# Patient Record
Sex: Male | Born: 1950 | Race: White | Hispanic: No | Marital: Married | Smoking: Never smoker
Health system: Southern US, Community
[De-identification: ages and names within clinical notes are randomized; demographics above are authoritative.]

## PROBLEM LIST (undated history)

## (undated) DIAGNOSIS — I10 Essential (primary) hypertension: Secondary | ICD-10-CM

## (undated) DIAGNOSIS — E7800 Pure hypercholesterolemia, unspecified: Secondary | ICD-10-CM

## (undated) DIAGNOSIS — N400 Enlarged prostate without lower urinary tract symptoms: Secondary | ICD-10-CM

## (undated) HISTORY — PX: TONSILLECTOMY: SUR1361

---

## 2015-03-07 ENCOUNTER — Ambulatory Visit
Admission: EM | Admit: 2015-03-07 | Discharge: 2015-03-07 | Disposition: A | Discharge status: Home / Self Care | Payer: Worker's Compensation | Attending: Internal Medicine | Admitting: Internal Medicine

## 2015-03-07 ENCOUNTER — Ambulatory Visit: Payer: Worker's Compensation

## 2015-03-07 DIAGNOSIS — W312XXA Contact with powered woodworking and forming machines, initial encounter: Secondary | ICD-10-CM | POA: Diagnosis not present

## 2015-03-07 DIAGNOSIS — S62631B Displaced fracture of distal phalanx of left index finger, initial encounter for open fracture: Secondary | ICD-10-CM | POA: Insufficient documentation

## 2015-03-07 DIAGNOSIS — S62638B Displaced fracture of distal phalanx of other finger, initial encounter for open fracture: Secondary | ICD-10-CM

## 2015-03-07 DIAGNOSIS — S61218A Laceration without foreign body of other finger without damage to nail, initial encounter: Secondary | ICD-10-CM | POA: Diagnosis present

## 2015-03-07 HISTORY — DX: Benign prostatic hyperplasia without lower urinary tract symptoms: N40.0

## 2015-03-07 HISTORY — DX: Pure hypercholesterolemia, unspecified: E78.00

## 2015-03-07 HISTORY — DX: Essential (primary) hypertension: I10

## 2015-03-07 MED ORDER — SULFAMETHOXAZOLE-TRIMETHOPRIM 800-160 MG PO TABS: 1.0000 | ORAL_TABLET | Freq: Two times a day (BID) | ORAL | Status: AC

## 2015-03-07 MED ORDER — LIDOCAINE-EPINEPHRINE-TETRACAINE (LET) SOLUTION
3.0000 mL | Freq: Once | NASAL | Status: AC
  Administered 2015-03-07: 3 mL via TOPICAL

## 2015-03-07 NOTE — Discharge Instructions (Signed)
Clean and Dry ! Elevate !! Additional instructions at [facility] tomorrow!  Thank you for choosing us for your care tonight. Rae HalstedLaurie W. Malana Eberwein PA-C      Laceration Care, Adult A laceration is a cut or lesion that goes through all layers of the skin and into the tissue just beneath the skin. TREATMENT  Some lacerations may not require closure. Some lacerations may not be able to be closed due to an increased risk of infection. It is important to see your caregiver as soon as possible after an injury to minimize the risk of infection and maximize the opportunity for successful closure. If closure is appropriate, pain medicines may be given, if needed. The wound will be cleaned to help prevent infection. Your caregiver will use stitches (sutures), staples, wound glue (adhesive), or skin adhesive strips to repair the laceration. These tools bring the skin edges together to allow for faster healing and a better cosmetic outcome. However, all wounds will heal with a scar. Once the wound has healed, scarring can be minimized by covering the wound with sunscreen during the day for 1 full year. HOME CARE INSTRUCTIONS  For sutures or staples:  Keep the wound clean and dry.  If you were given a bandage (dressing), you should change it at least once a day. Also, change the dressing if it becomes wet or dirty, or as directed by your caregiver.  Wash the wound with soap and water 2 times a day. Rinse the wound off with water to remove all soap. Pat the wound dry with a clean towel.  After cleaning, apply a thin layer of the antibiotic ointment as recommended by your caregiver. This will help prevent infection and keep the dressing from sticking.  You may shower as usual after the first 24 hours. Do not soak the wound in water until the sutures are removed.  Only take over-the-counter or prescription medicines for pain, discomfort, or fever as directed by your caregiver.  Get your sutures or  staples removed as directed by your caregiver. For skin adhesive strips:  Keep the wound clean and dry.  Do not get the skin adhesive strips wet. You may bathe carefully, using caution to keep the wound dry.  If the wound gets wet, pat it dry with a clean towel.  Skin adhesive strips will fall off on their own. You may trim the strips as the wound heals. Do not remove skin adhesive strips that are still stuck to the wound. They will fall off in time. For wound adhesive:  You may briefly wet your wound in the shower or bath. Do not soak or scrub the wound. Do not swim. Avoid periods of heavy perspiration until the skin adhesive has fallen off on its own. After showering or bathing, gently pat the wound dry with a clean towel.  Do not apply liquid medicine, cream medicine, or ointment medicine to your wound while the skin adhesive is in place. This may loosen the film before your wound is healed.  If a dressing is placed over the wound, be careful not to apply tape directly over the skin adhesive. This may cause the adhesive to be pulled off before the wound is healed.  Avoid prolonged exposure to sunlight or tanning lamps while the skin adhesive is in place. Exposure to ultraviolet light in the first year will darken the scar.  The skin adhesive will usually remain in place for 5 to 10 days, then naturally fall off the skin. Do  not pick at the adhesive film. You may need a tetanus shot if:  You cannot remember when you had your last tetanus shot.  You have never had a tetanus shot. If you get a tetanus shot, your arm may swell, get red, and feel warm to the touch. This is common and not a problem. If you need a tetanus shot and you choose not to have one, there is a rare chance of getting tetanus. Sickness from tetanus can be serious. SEEK MEDICAL CARE IF:   You have redness, swelling, or increasing pain in the wound.  You see a red line that goes away from the wound.  You have  yellowish-white fluid (pus) coming from the wound.  You have a fever.  You notice a bad smell coming from the wound or dressing.  Your wound breaks open before or after sutures have been removed.  You notice something coming out of the wound such as wood or glass.  Your wound is on your hand or foot and you cannot move a finger or toe. SEEK IMMEDIATE MEDICAL CARE IF:   Your pain is not controlled with prescribed medicine.  You have severe swelling around the wound causing pain and numbness or a change in color in your arm, hand, leg, or foot.  Your wound splits open and starts bleeding.  You have worsening numbness, weakness, or loss of function of any joint around or beyond the wound.  You develop painful lumps near the wound or on the skin anywhere on your body. MAKE SURE YOU:   Understand these instructions.  Will watch your condition.  Will get help right away if you are not doing well or get worse. Document Released: 08/13/2005 Document Revised: 11/05/2011 Document Reviewed: 02/06/2011 [facility] Patient Information 2015 [place], [place]. This information is not intended to replace advice given to you by your health care provider. Make sure you discuss any questions you have with your health care provider.

## 2015-03-07 NOTE — ED Notes (Signed)
Pt states "I cut my finger with a table saw."

## 2015-03-09 DIAGNOSIS — S62638B Displaced fracture of distal phalanx of other finger, initial encounter for open fracture: Secondary | ICD-10-CM | POA: Diagnosis not present

## 2015-03-09 NOTE — ED Provider Notes (Signed)
CSN: [id number]     Arrival date & time 03/07/15  1554 History   First MD Initiated Contact with Patient 03/07/15 1644     Chief Complaint  Patient presents with  . Laceration   (Consider location/radiation/quality/duration/timing/severity/associated sxs/prior Treatment) HPI 64 yo M using tablesaw at work, in error caught distal tip of left index finger in saw and partially amputated the tip about 4pm . Reports last injury and tetanus were 2 years ago, another laceration on same hand. He is coping with the injury, denies severe pain  Past Medical History  Diagnosis Date  . Hypertension   . Hypercholesteremia   . BPH (benign prostatic hyperplasia)    History reviewed. No pertinent past surgical history. History reviewed. No pertinent family history. History  Substance Use Topics  . Smoking status: Never Smoker   . Smokeless tobacco: Not on file  . Alcohol Use: 1.2 oz/week    2 Cans of beer per week    Review of Systems Constitutional: No fever.  Eyes: No visual changes. ENT:No sore throat. Cardiovascular:Negative for chest pain/palpitations,cardiac  Respiratory: Negative for shortness of breath, seasonal allergies Gastrointestinal: No abdominal pain. No nausea,vomiting, diarrhea Genitourinary: Negative for dysuria. Normal urination. Musculoskeletal: Negative for back pain. FROM extremities, pain distal left index finger; arthritis Skin: Negative for rash Neurological: Negative for headache, focal weakness or numbness  Allergies  Penicillins and Latex  Home Medications   Prior to Admission medications   Medication Sig Start Date End Date Taking? Authorizing Provider  alfuzosin (UROXATRAL) 10 MG 24 hr tablet Take 10 mg by mouth daily with breakfast.   Yes Historical Provider, MD  aspirin 81 MG tablet Take 81 mg by mouth daily.   Yes Historical Provider, MD  atorvastatin (LIPITOR) 10 MG tablet Take 10 mg by mouth daily.   Yes Historical Provider, MD  diclofenac (VOLTAREN)  75 MG EC tablet Take 75 mg by mouth 2 (two) times daily.   Yes Historical Provider, MD  diltiazem (CARTIA XT) 240 MG 24 hr capsule Take 240 mg by mouth daily.   Yes Historical Provider, MD  fluticasone (FLONASE) 50 MCG/ACT nasal spray Place into both nostrils daily.   Yes Historical Provider, MD  hydrochlorothiazide (MICROZIDE) 12.5 MG capsule Take 12.5 mg by mouth daily.   Yes Historical Provider, MD  sulfamethoxazole-trimethoprim (BACTRIM DS,SEPTRA DS) 800-160 MG per tablet Take 1 tablet by mouth 2 (two) times daily. 03/07/15 03/14/15  Rae Halsted, PA-C   BP 132/87 mmHg  Pulse 60  Temp(Src) 97.7 F (36.5 C) (Oral)  Resp 16  Ht  (1.753 m)  Wt 141 lb (63.957 kg)  BMI 20.81 kg/m2  SpO2 100% Physical Exam   Constitutional -alert and oriented,well appearing and in mild distress, concern over injury Head-atraumatic, normocephalic Eyes- conjunctiva normal, EOMI ,conjugate gaze Nose- no congestion or rhinorrhea Mouth/throat- mucous membranes moist , Neck- supple  CV- regular rate, grossly normal heart sounds,  Resp-no distress, normal respiratory effort,clear to auscultation bilaterally GI- soft,non-tender,no distention GU- not examined MSK- no tender, normal ROM, ambulatory, self-care; distal index left with open fracture- he can flex digit,reports good sensation except for flap, good grip; active bleeding Neuro- normal speech and language, no gross focal neurological deficit appreciated, no gait instability, Skin-warm,dry ,intact; no rash noted Psych-mood and affect grossly normal; speech and behavior grossly normal  ED Course  LACERATION REPAIR Date/Time: 03/09/2015 11:57 PM Performed by: Rae Halsted Authorized by: Eustace Moore Consent: Verbal consent obtained. Written consent not obtained. Risks  and benefits: risks, benefits and alternatives were discussed Consent given by: patient Patient understanding: patient states understanding of the procedure being  performed Imaging studies: imaging studies available Patient identity confirmed: verbally with patient and arm band Body area: upper extremity Location details: left index finger Laceration length: 2.5 cm Foreign bodies: unknown Tendon involvement: none Nerve involvement: none Anesthesia: local infiltration and digital block Local anesthetic: lidocaine 1% without epinephrine and LET (lido,epi,tetracaine) Anesthetic total: 4 ml Patient sedated: no Preparation: Patient was prepped and draped in the usual sterile fashion. Irrigation solution: saline Irrigation method: syringe Amount of cleaning: extensive Debridement: none Degree of undermining: none Skin closure: 3-0 nylon Number of sutures: 4 Technique: simple Approximation: loose Approximation difficulty: complex Dressing: pressure dressing and gauze roll Patient tolerance: Patient tolerated the procedure well with no immediate complications   (including critical care time) Labs Review Labs Reviewed - No data to display  Imaging Review No results found. Xrays did not drop- Open fracture of distal  phalanx  left index finger. Probable small bone fragments or small foreign bodies within the soft tissue.   MDM   1. Open fracture of distal phalanx of index finger,left    Dressing and wound care reviewed with patient - To keep clean and dry -elevated as much as possible Disc of films and written report sent with patient. [facility] [place] [facility] and wishes to have consultation and follow up care there tomorrow. Understands we have closed wound with loose approximating sutures given report of potential bone fragment  or foreign bodies. Ortho may opt to go to surgery or not. He has connection with Ortho in [place] and will seek care/consultation with them in the AM Final suture removal can be accompilshed in [place] or here pending the outcome of his consultation tomorrow. Antibiotic Rx  has been printed and sent with him for his convenience. He denies need for pain Rx. Encouraged to keep elevated to minimize swelling and discomfort. Encourage at least ibuprofen up to 800 mg TID with meals as needed. Diagnosis and treatment discussed. . Questions fielded, expectations and recommendations reviewed. Patient expresses understanding. [facility] with questions, concern or exacerbation.     Rae HalstedLaurie W Lee, PA-C 03/10/15 0010

## 2015-03-21 ENCOUNTER — Encounter: Payer: Self-pay | Admitting: Emergency Medicine

## 2015-03-21 ENCOUNTER — Ambulatory Visit
Admission: EM | Admit: 2015-03-21 | Discharge: 2015-03-21 | Disposition: A | Discharge status: Home / Self Care | Payer: Worker's Compensation | Attending: Internal Medicine | Admitting: Internal Medicine

## 2015-03-21 DIAGNOSIS — Z4802 Encounter for removal of sutures: Secondary | ICD-10-CM

## 2015-03-21 DIAGNOSIS — Z4801 Encounter for change or removal of surgical wound dressing: Secondary | ICD-10-CM

## 2015-03-21 DIAGNOSIS — S61412D Laceration without foreign body of left hand, subsequent encounter: Secondary | ICD-10-CM

## 2015-03-21 DIAGNOSIS — S61219D Laceration without foreign body of unspecified finger without damage to nail, subsequent encounter: Secondary | ICD-10-CM

## 2015-03-21 MED ORDER — MUPIROCIN 2 % EX OINT: 1.0000 "application " | TOPICAL_OINTMENT | Freq: Two times a day (BID) | CUTANEOUS | Status: AC

## 2015-03-21 NOTE — ED Notes (Signed)
Sutures to left index finger 2 weeks ago here. Returned for removal.

## 2015-03-21 NOTE — Discharge Instructions (Signed)

## 2015-03-22 NOTE — ED Provider Notes (Signed)
CSN: [id number]     Arrival date & time 03/21/15  8413 History   First MD Initiated Contact with Patient 03/21/15 1149     Chief Complaint  Patient presents with  . Suture / Staple Removal   (Consider location/radiation/quality/duration/timing/severity/associated sxs/prior Treatment) HPI Comments: Caucasian male here for suture removal after table saw injury to left index finger on 07 Mar 2015.   Saw PA Nedra Hai who sutured him up and because there was distal bone involvement referred to orthopedics.  Patient reported orthopedics took more xrays but left sutures PA Lee placed.  Has been applying OTC antibiotic twice a day and keeping covered with glove and bandaid at work.  Distal finger tip still numb but no swelling now.    Patient is a 64 y.o. male presenting with suture removal. The history is provided by the patient.  Suture / Staple Removal This is a new problem. The current episode started more than 1 week ago. The problem has been gradually improving. Pertinent negatives include no chest pain, no abdominal pain, no headaches and no shortness of breath. The symptoms are relieved by rest. The treatment provided mild relief.    Past Medical History  Diagnosis Date  . Hypertension   . Hypercholesteremia   . BPH (benign prostatic hyperplasia)    History reviewed. No pertinent past surgical history. History reviewed. No pertinent family history. History  Substance Use Topics  . Smoking status: Never Smoker   . Smokeless tobacco: Not on file  . Alcohol Use: 1.2 oz/week    2 Cans of beer per week    Review of Systems  Constitutional: Negative for fever, chills, diaphoresis, activity change, appetite change and fatigue.  HENT: Negative for congestion, dental problem, drooling and ear discharge.   Eyes: Negative for photophobia, pain, discharge, redness, itching and visual disturbance.  Respiratory: Negative for cough, shortness of breath and wheezing.   Cardiovascular: Negative for  chest pain and leg swelling.  Gastrointestinal: Negative for nausea, vomiting, abdominal pain, diarrhea and constipation.  Endocrine: Negative for cold intolerance and heat intolerance.  Genitourinary: Negative for dysuria and frequency.  Musculoskeletal: Positive for myalgias. Negative for joint swelling, gait problem, neck pain and neck stiffness.  Skin: Positive for color change and wound. Negative for pallor and rash.  Allergic/Immunologic: Negative for environmental allergies and food allergies.  Neurological: Positive for numbness. Negative for dizziness, tremors, speech difficulty, weakness and headaches.  Hematological: Negative for adenopathy. Does not bruise/bleed easily.  Psychiatric/Behavioral: Negative for behavioral problems, confusion and agitation.    Allergies  Penicillins and Latex  Home Medications   Prior to Admission medications   Medication Sig Start Date End Date Taking? Authorizing Provider  alfuzosin (UROXATRAL) 10 MG 24 hr tablet Take 10 mg by mouth daily with breakfast.    Historical Provider, MD  aspirin 81 MG tablet Take 81 mg by mouth daily.    Historical Provider, MD  atorvastatin (LIPITOR) 10 MG tablet Take 10 mg by mouth daily.    Historical Provider, MD  diclofenac (VOLTAREN) 75 MG EC tablet Take 75 mg by mouth 2 (two) times daily.    Historical Provider, MD  diltiazem (CARTIA XT) 240 MG 24 hr capsule Take 240 mg by mouth daily.    Historical Provider, MD  fluticasone (FLONASE) 50 MCG/ACT nasal spray Place into both nostrils daily.    Historical Provider, MD  hydrochlorothiazide (MICROZIDE) 12.5 MG capsule Take 12.5 mg by mouth daily.    Historical Provider, MD  mupirocin ointment Idelle Jo)  2 % Apply 1 application topically 2 (two) times daily. 03/21/15   Jarold Song Betancourt, NP   BP 126/92 mmHg  Pulse 65  Temp(Src) 98 F (36.7 C) (Oral)  Resp 16  Ht  (1.753 m)  Wt 138 lb (62.596 kg)  BMI 20.37 kg/m2  SpO2 99% Physical Exam  Constitutional:  He is oriented to person, place, and time. Vital signs are normal. He appears well-developed and well-nourished. No distress.  HENT:  Head: Normocephalic and atraumatic.  Right Ear: External ear normal.  Left Ear: External ear normal.  Nose: Nose normal.  Mouth/Throat: Oropharynx is clear and moist.  Eyes: Conjunctivae, EOM and lids are normal. Pupils are equal, round, and reactive to light. Right eye exhibits no discharge. Left eye exhibits no discharge. No scleral icterus.  Neck: Trachea normal and normal range of motion. Neck supple. No tracheal deviation present.  Cardiovascular: Normal rate, regular rhythm and intact distal pulses.   Pulmonary/Chest: Effort normal and breath sounds normal. No stridor. No respiratory distress. He has no wheezes.  Abdominal: Soft. He exhibits no distension.  Musculoskeletal: Normal range of motion. He exhibits tenderness. He exhibits no edema.       Left forearm: Normal.       Left hand: He exhibits tenderness, disruption of two-point discrimination and laceration. He exhibits normal range of motion, no bony tenderness, normal capillary refill, no deformity and no swelling. Decreased sensation noted. Normal strength noted. He exhibits no finger abduction, no thumb/finger opposition and no wrist extension trouble.       Hands: 2nd left distal finger Laceration with scaling scab all 4 sutures intact but wound edges not fully approximated/healed  Dry no discharge or erythema smells of antibiotic ointment capillary refill less than 3 seconds; distal fingertip flap slightly less pink than unaffected proximal skin possibly sloughing after sutures removed noted wound bed beefy pink under scab; placed steristrips x 5 posterior index finger after cleansing with alcohol and hibiclens to intact skin placed bandaid over top and then hard plastic distal finger splint prior to discharge  Neurological: He is alert and oriented to person, place, and time. He exhibits normal  muscle tone. Coordination normal.  Skin: Skin is warm and dry. Laceration noted. No abrasion, no bruising, no burn, no ecchymosis, no lesion, no petechiae, no purpura and no rash noted. Rash is not macular, not papular, not maculopapular, not nodular, not pustular, not vesicular and not urticarial. He is not diaphoretic. No cyanosis or erythema. Nails show no clubbing.     Psychiatric: He has a normal mood and affect. His speech is normal and behavior is normal. Judgment and thought content normal. Cognition and memory are normal.  Nursing note and vitals reviewed.   ED Course  Procedures (including critical care time) Labs Review Labs Reviewed - No data to display  Imaging Review No results found.   MDM   1. Laceration of finger of left hand with complication, subsequent encounter   2. Visit for suture removal    Patient was instructed to rest, ice and elevate left hand.  Wear finger spint on left index finger until wound edges fully healed.  Avoid impact to left hand.  Do not soak hand until lacerations fullyt healed avoid pool, lake, hot tub, dirty sink water.  Exitcare handout on suture removal and laceration given to patient.   Medications as directed. bactroban 2% apply to affected area twice a day.  Keep covered when at work with glove avoid contamination of  healing wound bed may cover with gauze or bandaid. 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  Follow up in one week for re-evaluation of wound healing as delayed wound approximation, possible sloughing of distal laceration flap. Call or return to clinic as needed if these symptoms worsen or fail to improve as anticipated e.g. Signs of infection, swelling, fingertip blue or pain reoccurs.  Patient verbalized agreement and understanding of treatment plan.  P2:  ROM, injury prevention    Barbaraann Barthel, NP 03/22/15 2048

## 2015-03-28 ENCOUNTER — Ambulatory Visit: Payer: Worker's Compensation

## 2015-03-28 ENCOUNTER — Ambulatory Visit
Admission: EM | Admit: 2015-03-28 | Discharge: 2015-03-28 | Disposition: A | Discharge status: Home / Self Care | Payer: Worker's Compensation | Attending: Internal Medicine | Admitting: Internal Medicine

## 2015-03-28 DIAGNOSIS — I10 Essential (primary) hypertension: Secondary | ICD-10-CM | POA: Diagnosis not present

## 2015-03-28 DIAGNOSIS — L03012 Cellulitis of left finger: Secondary | ICD-10-CM

## 2015-03-28 DIAGNOSIS — Z7982 Long term (current) use of aspirin: Secondary | ICD-10-CM | POA: Insufficient documentation

## 2015-03-28 DIAGNOSIS — Z79899 Other long term (current) drug therapy: Secondary | ICD-10-CM | POA: Insufficient documentation

## 2015-03-28 DIAGNOSIS — E780 Pure hypercholesterolemia: Secondary | ICD-10-CM | POA: Diagnosis not present

## 2015-03-28 DIAGNOSIS — N400 Enlarged prostate without lower urinary tract symptoms: Secondary | ICD-10-CM | POA: Insufficient documentation

## 2015-03-28 DIAGNOSIS — S62623G Displaced fracture of medial phalanx of left middle finger, subsequent encounter for fracture with delayed healing: Secondary | ICD-10-CM | POA: Diagnosis present

## 2015-03-28 DIAGNOSIS — S62639G Displaced fracture of distal phalanx of unspecified finger, subsequent encounter for fracture with delayed healing: Secondary | ICD-10-CM

## 2015-03-28 DIAGNOSIS — IMO0002 Reserved for concepts with insufficient information to code with codable children: Secondary | ICD-10-CM

## 2015-03-28 DIAGNOSIS — T148 Other injury of unspecified body region: Secondary | ICD-10-CM

## 2015-03-28 DIAGNOSIS — X58XXXD Exposure to other specified factors, subsequent encounter: Secondary | ICD-10-CM | POA: Diagnosis not present

## 2015-03-28 MED ORDER — SULFAMETHOXAZOLE-TRIMETHOPRIM 800-160 MG PO TABS: 1.0000 | ORAL_TABLET | Freq: Two times a day (BID) | ORAL | Status: AC

## 2015-03-28 NOTE — ED Provider Notes (Signed)
CSN: [id number]     Arrival date & time 03/28/15  1330 History   First MD Initiated Contact with Patient 03/28/15 1333     Chief Complaint  Patient presents with  . Wound Check   (Consider location/radiation/quality/duration/timing/severity/associated sxs/prior Treatment) HPI Comments: Married caucasian male here to follow up for wound check s/p table saw injury 11 Jul and suture removal last week.  Has been applying bactroban 2% BID and changing dressings as needed.  Not wearing finger splint as bulky gets in his way.  Reported soaking finger to assist with dressing removal in dreft and water mixture.  Has noticed slightly increased discharge/bleeding and red streak under nail bed.  Denied worsening pain.  Tip of finger still numb.  The history is provided by the patient.    Past Medical History  Diagnosis Date  . Hypertension   . Hypercholesteremia   . BPH (benign prostatic hyperplasia)    Past Surgical History  Procedure Laterality Date  . Tonsillectomy     Family History  Problem Relation Age of Onset  . Heart attack Father    History  Substance Use Topics  . Smoking status: Never Smoker   . Smokeless tobacco: Not on file  . Alcohol Use: 1.2 oz/week    2 Cans of beer per week     Comment: socially    Review of Systems  Constitutional: Negative for fever, chills, diaphoresis, activity change, appetite change and fatigue.  HENT: Negative for congestion, dental problem, drooling, ear discharge, ear pain and facial swelling.   Eyes: Negative for pain, discharge and itching.  Respiratory: Negative for shortness of breath, wheezing and stridor.   Cardiovascular: Negative for chest pain, palpitations and leg swelling.  Gastrointestinal: Negative for nausea, vomiting and diarrhea.  Endocrine: Negative for cold intolerance and heat intolerance.  Genitourinary: Negative for dysuria and hematuria.  Musculoskeletal: Positive for myalgias. Negative for back pain, joint swelling,  arthralgias, gait problem, neck pain and neck stiffness.  Skin: Positive for color change and wound. Negative for pallor and rash.  Allergic/Immunologic: Positive for environmental allergies. Negative for food allergies.  Neurological: Negative for dizziness, facial asymmetry, weakness, light-headedness and headaches.  Hematological: Negative for adenopathy. Does not bruise/bleed easily.  Psychiatric/Behavioral: Negative for behavioral problems, confusion, sleep disturbance and agitation.    Allergies  Penicillins and Latex  Home Medications   Prior to Admission medications   Medication Sig Start Date End Date Taking? Authorizing Provider  alfuzosin (UROXATRAL) 10 MG 24 hr tablet Take 10 mg by mouth daily with breakfast.   Yes Historical Provider, MD  aspirin 81 MG tablet Take 81 mg by mouth daily.   Yes Historical Provider, MD  atorvastatin (LIPITOR) 10 MG tablet Take 10 mg by mouth daily.   Yes Historical Provider, MD  diclofenac (VOLTAREN) 75 MG EC tablet Take 75 mg by mouth 2 (two) times daily.   Yes Historical Provider, MD  diltiazem (CARTIA XT) 240 MG 24 hr capsule Take 240 mg by mouth daily.   Yes Historical Provider, MD  fluticasone (FLONASE) 50 MCG/ACT nasal spray Place into both nostrils daily.   Yes Historical Provider, MD  hydrochlorothiazide (MICROZIDE) 12.5 MG capsule Take 12.5 mg by mouth daily.   Yes Historical Provider, MD  mupirocin ointment (BACTROBAN) 2 % Apply 1 application topically 2 (two) times daily. 03/21/15  Yes Barbaraann Barthel, NP  sulfamethoxazole-trimethoprim (BACTRIM DS,SEPTRA DS) 800-160 MG per tablet Take 1 tablet by mouth 2 (two) times daily. 03/28/15   Jarold Song  Betancourt, NP   BP 130/96 mmHg  Pulse 78  Temp(Src) 97.6 F (36.4 C) (Oral)  Resp 18  Ht 5\' 9"  (1.753 m)  Wt 138 lb (62.596 kg)  BMI 20.37 kg/m2  SpO2 99% Physical Exam  Constitutional: He is oriented to person, place, and time. Vital signs are normal. He appears well-developed and  well-nourished. No distress.  HENT:  Head: Normocephalic and atraumatic.  Right Ear: External ear normal.  Left Ear: External ear normal.  Nose: Nose normal.  Mouth/Throat: Oropharynx is clear and moist. No oropharyngeal exudate.  Eyes: Conjunctivae, EOM and lids are normal. Pupils are equal, round, and reactive to light. Right eye exhibits no discharge. Left eye exhibits no discharge. No scleral icterus.  Neck: Trachea normal and normal range of motion. Neck supple. No tracheal deviation present.  Cardiovascular: Normal rate, regular rhythm and intact distal pulses.   Pulmonary/Chest: Effort normal and breath sounds normal. No stridor. No respiratory distress. He has no wheezes.  Abdominal: Soft.  Musculoskeletal: Normal range of motion. He exhibits edema and tenderness.       Right wrist: Normal.       Left wrist: Normal.       Left hand: He exhibits tenderness, disruption of two-point discrimination, laceration and swelling. He exhibits normal range of motion, no bony tenderness, normal capillary refill and no deformity. Decreased sensation noted. Normal strength noted. He exhibits no finger abduction, no thumb/finger opposition and no wrist extension trouble.       Hands: Laceration continues not well approximated wound edges skin slight maceration dermis lateral nailbed border with scant purulent discharge erythema; full arom; DIP joint and distal fingertip TTP removed cobain and 1 inch gauze wrap dry except for scant purulent serosanguinous discharge noted on gause  Neurological: He is alert and oriented to person, place, and time. He displays no atrophy and no tremor. A sensory deficit is present. No cranial nerve deficit. He exhibits normal muscle tone. He displays no seizure activity. Coordination and gait normal.  Skin: Skin is warm and dry. Laceration noted. No abrasion, no bruising, no burn, no ecchymosis, no lesion, no petechiae and no rash noted. He is not diaphoretic. There is  erythema. No pallor.  Lateral 2nd dip laceration with scant purulent discharge TTP; 0.5cm erythematous streak under lateral nailbed slightly TTP  Psychiatric: He has a normal mood and affect. His speech is normal and behavior is normal. Judgment and thought content normal. Cognition and memory are normal.  Nursing note and vitals reviewed.   ED Course  Procedures (including critical care time) Labs Review Labs Reviewed - No data to display  Imaging Review Dg Finger Index Left  03/28/2015   CLINICAL DATA:  64 year old male post injury 03/07/2015 now with purulent drainage. Subsequent encounter.  EXAM: LEFT INDEX FINGER 2+V  COMPARISON:  03/07/2015.  FINDINGS: Cortical defect of the volar aspect of the left second finger distal phalanx once again noted. Adjacent radiopaque material which may represent displaced ossific fragments. Adjacent soft tissue defect. In the proper clinical setting superimposed infection may be present although the cortical defect/ irregularity is relatively similar to prior exam.  IMPRESSION: Cortical defect of the volar aspect of the left second finger distal phalanx once again noted.  Adjacent radiopaque material which may represent displaced ossific fragments.  Adjacent soft tissue defect.  In the proper clinical setting superimposed infection may be present (although the cortical defect/ irregularity is relatively similar to prior exam).   Electronically Signed   By: Viviann Spare  Constance Goltz M.D.   On: 03/28/2015 14:35   RN Rosanne Ashing applied triple antibiotic, simple dressing and finger splint prior to patient discharge left index finger.  Discussed xray results with patient and given copy of radiology report.  Images on disk prepared for patient and he forgot at clinic--left at front desk for pickup.  Patient verbalized understanding of information/instructions, agreed with plan of care and had no further questions at this time.  MDM   1. Cellulitis of finger of left hand   2.  Laceration   3. Open fracture of tuft of distal phalanx of finger, with delayed healing, subsequent encounter   restart bactrim DS po BID, continue bactroban 2% application BID with dressing changes.  Wear finger splint!!!  Patient not in splint upon arrival today.  Fracture still not healed.  Wound check in 72 hours with Dr Dayton Scrape.  Has to go with wife to dr appt wed 3 Aug. Discussed osteomyelitis risk with patient.  Follow up sooner if worsening symptoms for re-evaluation.  Work restrictions given avoid impact, lifting with left hand.  Avoid immersing in liquids left hand.  Patient to follow up with orthopedics as previously arranged to contact their office for re-evaluation xray does not appear worsened.  Stop soaking finger in dreft and water.  Keep clean and dry may shower and allow soapy water to rinse over area only.  Change dressing if saturated prn and with application of bactroban.  Follow up if worsening erythema, swelling, purulent discharge, pain.  Tylenol  po QID prn pain.  Exitcare handout on finger tip infection, cellulitis given to patient.  Barbaraann Barthel, NP 03/28/15 1805

## 2015-03-28 NOTE — Discharge Instructions (Signed)
Cellulitis °Cellulitis is an infection of the skin and the tissue beneath it. The infected area is usually red and tender. Cellulitis occurs most often in the arms and lower legs.  °CAUSES  °Cellulitis is caused by bacteria that enter the skin through cracks or cuts in the skin. The most common types of bacteria that cause cellulitis are staphylococci and streptococci. °SIGNS AND SYMPTOMS  °· Redness and warmth. °· Swelling. °· Tenderness or pain. °· Fever. °DIAGNOSIS  °Your health care provider can usually determine what is wrong based on a physical exam. Blood tests may also be done. °TREATMENT  °Treatment usually involves taking an antibiotic medicine. °HOME CARE INSTRUCTIONS  °· Take your antibiotic medicine as directed by your health care provider. Finish the antibiotic even if you start to feel better. °· Keep the infected arm or leg elevated to reduce swelling. °· Apply a warm cloth to the affected area up to 4 times per day to relieve pain. °· Take medicines only as directed by your health care provider. °· Keep all follow-up visits as directed by your health care provider. °SEEK MEDICAL CARE IF:  °· You notice red streaks coming from the infected area. °· Your red area gets larger or turns dark in color. °· Your bone or joint underneath the infected area becomes painful after the skin has healed. °· Your infection returns in the same area or another area. °· You notice a swollen bump in the infected area. °· You develop new symptoms. °· You have a fever. °SEEK IMMEDIATE MEDICAL CARE IF:  °· You feel very sleepy. °· You develop vomiting or diarrhea. °· You have a general ill feeling (malaise) with muscle aches and pains. °MAKE SURE YOU:  °· Understand these instructions. °· Will watch your condition. °· Will get help right away if you are not doing well or get worse. °Document Released: 05/23/2005 Document Revised: 12/28/2013 Document Reviewed: 10/29/2011 °ExitCare® Patient Information ©2015 [employer].  This information is not intended to replace advice given to you by your health care provider. Make sure you discuss any questions you have with your health care provider. ° °Fingertip Infection °When an infection is around the nail, it is called a paronychia. When it appears over the tip of the finger, it is called a felon. These infections are due to minor injuries or cracks in the skin. If they are not treated properly, they can lead to bone infection and permanent damage to the fingernail. °Incision and drainage is necessary if a pus pocket (an abscess) has formed. Antibiotics and pain medicine may also be needed. Keep your hand elevated for the next 2-3 days to reduce swelling and pain. If a pack was placed in the abscess, it should be removed in 1-2 days by your caregiver. Soak the finger in warm water for 20 minutes 4 times daily to help promote drainage. °Keep the hands as dry as possible. Wear protective gloves with cotton liners. See your caregiver for follow-up care as recommended.  °HOME CARE INSTRUCTIONS  °· Keep wound clean, dry and dressed as suggested by your caregiver. °· Soak in warm salt water for fifteen minutes, four times per day for bacterial infections. °· Your caregiver will prescribe an antibiotic if a bacterial infection is suspected. Take antibiotics as directed and finish the prescription, even if the problem appears to be improving before the medicine is gone. °· Only take over-the-counter or prescription medicines for pain, discomfort, or fever as directed by your caregiver. °SEEK IMMEDIATE   MEDICAL CARE IF: °· There is redness, swelling, or increasing pain in the wound. °· Pus or any other unusual drainage is coming from the wound. °· An unexplained oral temperature above 102° F (38.9° C) develops. °· You notice a foul smell coming from the wound or dressing. °MAKE SURE YOU:  °· Understand these instructions. °· Monitor your condition. °· Contact your caregiver if you are getting worse  or not improving. °Document Released: 09/20/2004 Document Revised: 11/05/2011 Document Reviewed: 09/16/2008 °ExitCare® Patient Information ©2015 [employer]. This information is not intended to replace advice given to you by your health care provider. Make sure you discuss any questions you have with your health care provider. ° °

## 2015-03-28 NOTE — ED Notes (Signed)
Seen here 03/07/15 with left 2nd finger injury. Injured on a saw

## 2015-03-31 ENCOUNTER — Encounter: Payer: Self-pay | Admitting: Emergency Medicine

## 2015-03-31 ENCOUNTER — Ambulatory Visit
Admission: EM | Admit: 2015-03-31 | Discharge: 2015-03-31 | Disposition: A | Discharge status: Home / Self Care | Payer: Worker's Compensation | Attending: Internal Medicine | Admitting: Internal Medicine

## 2015-03-31 DIAGNOSIS — S62631G Displaced fracture of distal phalanx of left index finger, subsequent encounter for fracture with delayed healing: Secondary | ICD-10-CM

## 2015-03-31 DIAGNOSIS — T798XXD Other early complications of trauma, subsequent encounter: Secondary | ICD-10-CM

## 2015-03-31 MED ORDER — CEPHALEXIN 500 MG PO CAPS: 500.0000 mg | ORAL_CAPSULE | Freq: Four times a day (QID) | ORAL | Status: AC

## 2015-03-31 NOTE — Discharge Instructions (Signed)
Referral to [facility] Begin Keflex as directed  Complete Septra as directed Dressing Change A dressing is a material placed over wounds. It keeps the wound clean, dry, and protected from further injury. This provides an environment that favors wound healing.  BEFORE YOU BEGIN  Get your supplies together. Things you may need include:  Saline solution.  Flexible gauze dressing.  Medicated cream.  Tape.  Gloves.  Abdominal dressing pads.  Gauze squares.  Plastic bags.  Take pain medicine 30 minutes before the dressing change if you need it.  Take a shower before you do the first dressing change of the day. Use plastic wrap or a plastic bag to prevent the dressing from getting wet. REMOVING YOUR OLD DRESSING   Wash your hands with soap and water. Dry your hands with a clean towel.  Put on your gloves.  Remove any tape.  Carefully remove the old dressing. If the dressing sticks, you may dampen it with warm water to loosen it, or follow your caregiver's specific directions.  Remove any gauze or packing tape that is in your wound.  Take off your gloves.  Put the gloves, tape, gauze, or any packing tape into a plastic bag. CHANGING YOUR DRESSING  Open the supplies.  Take the cap off the saline solution.  Open the gauze package so that the gauze remains on the inside of the package.  Put on your gloves.  Clean your wound as told by your caregiver.  If you have been told to keep your wound dry, follow those instructions.  Your caregiver may tell you to do one or more of the following:  Pick up the gauze. Pour the saline solution over the gauze. Squeeze out the extra saline solution.  Put medicated cream or other medicine on your wound if you have been told to do so.  Put the solution soaked gauze only in your wound, not on the skin around it.  Pack your wound loosely or as told by your caregiver.  Put dry gauze on your wound.  Put abdominal  dressing pads over the dry gauze if your wet gauze soaks through.  Tape the abdominal dressing pads in place so they will not fall off. Do not wrap the tape completely around the affected part (arm, leg, abdomen).  Wrap the dressing pads with a flexible gauze dressing to secure it in place.  Take off your gloves. Put them in the plastic bag with the old dressing. Tie the bag shut and throw it away.  Keep the dressing clean and dry until your next dressing change.  Wash your hands. SEEK MEDICAL CARE IF:  Your skin around the wound looks red.  Your wound feels more tender or sore.  You see pus in the wound.  Your wound smells bad.  You have a fever.  Your skin around the wound has a rash that itches and burns.  You see black or yellow skin in your wound that was not there before.  You feel nauseous, throw up, and feel very tired. Document Released: 09/20/2004 Document Revised: 11/05/2011 Document Reviewed: 06/25/2011 [facility] Patient Information 2015 [place], [place]. This information is not intended to replace advice given to you by your health care provider. Make sure you discuss any questions you have with your health care provider.

## 2015-03-31 NOTE — ED Provider Notes (Signed)
CSN: [id number]     Arrival date & time 03/31/15  1144 History   First MD Initiated Contact with Patient 03/28/15 1333     Chief Complaint  Patient presents with  . Follow-up   HPI Comments: Married caucasian male here to follow up for left 2nd digit wound check s/p table saw injury 7/11.  Pt reports tip of finger and joint are becoming stiff and he feels like tip of his finger has a funny feeling. He denies any fever or chills. Denies abdominal pain, nausea, vomiting or diarrhea. He denies any finger or hand pain. He denies any discharge from the wound.  Has been applying bactroban 2% BID and changing dressings as needed. He has been wearing finger splint but it is bulky & gets in his way.    The history is provided by the patient.    Past Medical History  Diagnosis Date  . Hypertension   . Hypercholesteremia   . BPH (benign prostatic hyperplasia)    Past Surgical History  Procedure Laterality Date  . Tonsillectomy     Family History  Problem Relation Age of Onset  . Heart attack Father    History  Substance Use Topics  . Smoking status: Never Smoker   . Smokeless tobacco: Not on file  . Alcohol Use: 1.2 oz/week    2 Cans of beer per week     Comment: socially    Review of Systems  Constitutional: Negative for fever, chills, diaphoresis, activity change, appetite change and fatigue.  HENT: Negative for congestion, dental problem, drooling, ear discharge, ear pain and facial swelling.   Eyes: Negative for pain, discharge and itching.  Respiratory: Negative for shortness of breath, wheezing and stridor.   Cardiovascular: Negative for chest pain, palpitations and leg swelling.  Gastrointestinal: Negative for nausea, vomiting and diarrhea.  Endocrine: Negative for cold intolerance and heat intolerance.  Genitourinary: Negative for dysuria and hematuria.  Musculoskeletal: Negative for myalgias, back pain, joint swelling, arthralgias, gait problem, neck pain and neck stiffness.    Skin: Positive for color change and wound. Negative for pallor and rash.  Allergic/Immunologic: Positive for environmental allergies. Negative for food allergies.  Neurological: Negative for dizziness, facial asymmetry, weakness, light-headedness and headaches.  Hematological: Negative for adenopathy. Does not bruise/bleed easily.  Psychiatric/Behavioral: Negative for behavioral problems, confusion, sleep disturbance and agitation.    Allergies  Penicillins and Latex  PT HAS TOLERATED KEFLEX WELL DESPITE HX OF PCN ALLERGY  Home Medications   Prior to Admission medications   Medication Sig Start Date End Date Taking? Authorizing Provider  alfuzosin (UROXATRAL) 10 MG 24 hr tablet Take 10 mg by mouth daily with breakfast.   Yes Historical Provider, MD  aspirin 81 MG tablet Take 81 mg by mouth daily.   Yes Historical Provider, MD  atorvastatin (LIPITOR) 10 MG tablet Take 10 mg by mouth daily.   Yes Historical Provider, MD  diclofenac (VOLTAREN) 75 MG EC tablet Take 75 mg by mouth 2 (two) times daily.   Yes Historical Provider, MD  diltiazem (CARTIA XT) 240 MG 24 hr capsule Take 240 mg by mouth daily.   Yes Historical Provider, MD  fluticasone (FLONASE) 50 MCG/ACT nasal spray Place into both nostrils daily.   Yes Historical Provider, MD  hydrochlorothiazide (MICROZIDE) 12.5 MG capsule Take 12.5 mg by mouth daily.   Yes Historical Provider, MD  mupirocin ointment (BACTROBAN) 2 % Apply 1 application topically 2 (two) times daily. 03/21/15  Yes Barbaraann Barthel, NP  sulfamethoxazole-trimethoprim (BACTRIM DS,SEPTRA DS) 800-160 MG per tablet Take 1 tablet by mouth 2 (two) times daily. 03/28/15  Yes Barbaraann Barthel, NP  cephALEXin (KEFLEX) 500 MG capsule Take 1 capsule (500 mg total) by mouth 4 (four) times daily. 03/31/15   Joselyn Arrow, NP   BP 122/84 mmHg  Pulse 62  Temp(Src) 98.1 F (36.7 C) (Oral)  Resp 16  SpO2 99% Physical Exam  Constitutional: He is oriented to person, place, and  time. Vital signs are normal. He appears well-developed and well-nourished. No distress.  HENT:  Head: Normocephalic and atraumatic.  Right Ear: External ear normal.  Left Ear: External ear normal.  Nose: Nose normal.  Mouth/Throat: Oropharynx is clear and moist. No oropharyngeal exudate.  Eyes: Conjunctivae, EOM and lids are normal. Pupils are equal, round, and reactive to light. Right eye exhibits no discharge. Left eye exhibits no discharge. No scleral icterus.  Neck: Trachea normal and normal range of motion. Neck supple. No tracheal deviation present.  Cardiovascular: Normal rate, regular rhythm and intact distal pulses.   Pulmonary/Chest: Effort normal and breath sounds normal. No stridor. No respiratory distress. He has no wheezes.  Abdominal: Soft.  Musculoskeletal: Normal range of motion. He exhibits edema and tenderness.       Right wrist: Normal.       Left wrist: Normal.       Left hand: He exhibits tenderness, disruption of two-point discrimination, laceration and swelling. He exhibits normal range of motion, no bony tenderness, normal capillary refill and no deformity. Decreased sensation noted. Normal strength noted. He exhibits no finger abduction, no thumb/finger opposition and no wrist extension trouble.       Hands: Laceration irregular, not well approximated wound edges.  Digit tip with slight maceration dermis lateral nailbed border without purulent discharge, mild  erythema; full arom; DIP joint and distal fingertip TTP.  No active bleeding or purulent discharge.  Neurological: He is alert and oriented to person, place, and time. He displays no atrophy and no tremor. A sensory deficit is present. No cranial nerve deficit. He exhibits normal muscle tone. He displays no seizure activity. Coordination and gait normal.  Skin: Skin is warm and dry. Laceration noted. No abrasion, no bruising, no burn, no ecchymosis, no lesion, no petechiae and no rash noted. He is not diaphoretic.  There is erythema. No cyanosis. No pallor. Nails show no clubbing.  Psychiatric: He has a normal mood and affect. His speech is normal and behavior is normal. Judgment and thought content normal. Cognition and memory are normal.  Nursing note and vitals reviewed.   ED Course  Procedures  Discussed need for ongoing management at [facility]. I spoke with Arty Baumgartner at [facility] regarding policy number [id number]. Contact [phone] extension [phone] for approval.  Bulky finger dressing applied per nursing staff  MDM   1. Wound infection, posttraumatic, subsequent encounter   2. Open fracture of distal phalanx of second finger of left hand, with delayed healing, subsequent encounter    Slow to heal/failing ischemic flap of left second digit open fx.  I have discussed his care with Dr Dayton Scrape & she also re-evaluated the patient today.  1) Referral to [facility] 2) Begin Keflex as directed  3) Complete Septra as directed 4) continue bactroban 2% application BID with dressing changes  Lorenza Burton, FNP-BC  1:25 PM     Joselyn Arrow, NP 03/31/15 1325

## 2015-03-31 NOTE — ED Notes (Signed)
Pt states that he is here for a follow up on his finger he cut at work with a table saw.

## 2015-04-04 ENCOUNTER — Encounter: Payer: BLUE CROSS/BLUE SHIELD | Attending: Surgery | Admitting: Surgery

## 2015-04-04 DIAGNOSIS — X58XXXA Exposure to other specified factors, initial encounter: Secondary | ICD-10-CM | POA: Diagnosis not present

## 2015-04-04 DIAGNOSIS — N400 Enlarged prostate without lower urinary tract symptoms: Secondary | ICD-10-CM | POA: Insufficient documentation

## 2015-04-04 DIAGNOSIS — E780 Pure hypercholesterolemia: Secondary | ICD-10-CM | POA: Diagnosis not present

## 2015-04-04 DIAGNOSIS — S61211A Laceration without foreign body of left index finger without damage to nail, initial encounter: Secondary | ICD-10-CM | POA: Diagnosis present

## 2015-04-04 DIAGNOSIS — Z85828 Personal history of other malignant neoplasm of skin: Secondary | ICD-10-CM | POA: Insufficient documentation

## 2015-04-04 DIAGNOSIS — I10 Essential (primary) hypertension: Secondary | ICD-10-CM | POA: Insufficient documentation

## 2015-04-04 DIAGNOSIS — M1990 Unspecified osteoarthritis, unspecified site: Secondary | ICD-10-CM | POA: Insufficient documentation

## 2015-04-04 DIAGNOSIS — T8131XA Disruption of external operation (surgical) wound, not elsewhere classified, initial encounter: Secondary | ICD-10-CM | POA: Diagnosis not present

## 2015-04-04 NOTE — Progress Notes (Addendum)
JAWUAN, ROBB ([id number]) Visit Report for 04/04/2015 Chief Complaint Document Details Patient Name: Dwayne Moreno, Dwayne Moreno Date of Service: 04/04/2015 10:15 AM Medical Record Number: [id number] Patient Account Number: [id number] Date of Birth/Sex: November 02, 1950 (64 y.o. Male) Treating RN: Primary Care Physician: Jessee Avers Other Clinician: Referring Physician: Ria Clock Treating Physician/Extender: Rudene Re in Treatment: 0 Information Obtained from: Patient Chief Complaint Patient presents to the wound care center for a consult due non healing wound. 64 year old gentleman who had an injury to his left index finger with a table saw on 03/07/2015. Electronic Signature(s) Signed: 04/04/2015 11:11:01 AM By: Evlyn Kanner MD, FACS Entered By: Evlyn Kanner on 04/04/2015 11:11:01 Dwayne Moreno ([id number]) -------------------------------------------------------------------------------- Debridement Details Patient Name: Dwayne Moreno Date of Service: 04/04/2015 10:15 AM Medical Record Number: [id number] Patient Account Number: [id number] Date of Birth/Sex: 1951/08/07 (64 y.o. Male) Treating RN: Primary Care Physician: Jessee Avers Other Clinician: Referring Physician: Ria Clock Treating Physician/Extender: Rudene Re in Treatment: 0 Debridement Performed for Wound #1 Left Hand - 2nd Digit Assessment: Performed By: Physician Tristan Schroeder., MD Debridement: Debridement Pre-procedure Yes Verification/Time Out Taken: Start Time: 10:57 Pain Control: Other : lidocaine 4% Level: Skin/Subcutaneous Tissue Total Area Debrided (L x 0.2 (cm) x 2 (cm) = 0.4 (cm) W): Tissue and other Viable, Non-Viable, Eschar, Exudate, Fibrin/Slough, Subcutaneous material debrided: Instrument: Forceps, Scissors Bleeding: Minimum Hemostasis Achieved: Pressure End Time: 11:00 Procedural Pain: 2 Post Procedural Pain: 3 Response to Treatment: Procedure was tolerated well Post Debridement  Measurements of Total Wound Length: (cm) 0.4 Width: (cm) 2 Depth: (cm) 0.3 Volume: (cm) 0.188 Electronic Signature(s) Signed: 04/04/2015 11:10:27 AM By: Evlyn Kanner MD, FACS Entered By: Evlyn Kanner on 04/04/2015 11:10:27 Dwayne Moreno ([id number]) -------------------------------------------------------------------------------- HPI Details Patient Name: Dwayne Moreno Date of Service: 04/04/2015 10:15 AM Medical Record Number: [id number] Patient Account Number: [id number] Date of Birth/Sex: 09-12-1950 (64 y.o. Male) Treating RN: Primary Care Physician: Jessee Avers Other Clinician: Referring Physician: Ria Clock Treating Physician/Extender: Rudene Re in Treatment: 0 History of Present Illness Location: injury to the palmar aspect of the left index finger Quality: Patient reports experiencing a dull pain to affected area(s). Severity: Patient states wound are getting worse. Duration: Patient has had the wound for < 4 weeks prior to presenting for treatment Timing: Pain in wound is Intermittent (comes and goes Context: The wound occurred when the patient cut himself with a table saw while working Modifying Factors: Consults to this date include: urgent care and ER at [facility] Associated Signs and Symptoms: Patient reports having: numbness in the tip of the finger and difficulty in flexing his finger at all joints. HPI Description: The patient had a injury with a table saw on 03/07/2015 and was seen in the ER. He was sent to [facility] for a orthopedic opinion and seek further care there. The wound was loosely sutured in the ER here and was sent for further care to [facility]. He was seen at [facility] and after discussing with the ER physicians who spoke to the plastic surgeons they recommended no further intervention at that stage. He was given an IV antibiotic and some tetanus toxoid. The sutures were removed subsequently in 2 weeks at the Urgent care and the  patient was previously on Septra and was recently changed over to Keflex. He was referred to [place] for poor wound healing and open fracture of the distal phalanx of second finger of the left hand. X-ray of the left hand done on 03/07/2015 -- IMPRESSION:Open fracture involving the distal  phalanx of the index finger as described. Small associated foreign bodies difficult to exclude. Repeat x-ray done on 03/28/2015 - IMPRESSION:Cortical defect of the volar aspect of the left second finger distal phalanx once again noted. Adjacent radiopaque material which may represent displaced ossific fragments. Adjacent soft tissue defect. The patient reports to have less sensation in the tip of his finger and is unable to bend the finger as before. He has minimal pain in this area. He has not seen a hand surgeon, plastic surgeon or a orthopedic surgeon since the injury. Electronic Signature(s) Signed: 04/04/2015 11:15:51 AM By: Evlyn Kanner MD, FACS Previous Signature: 04/04/2015 10:37:18 AM Version By: Evlyn Kanner MD, FACS Previous Signature: 04/04/2015 10:33:04 AM Version By: Evlyn Kanner MD, FACS Entered By: Evlyn Kanner on 04/04/2015 11:15:51 Dwayne Moreno ([id number]) -------------------------------------------------------------------------------- Physical Exam Details Patient Name: Dwayne Moreno Date of Service: 04/04/2015 10:15 AM Medical Record Number: [id number] Patient Account Number: [id number] Date of Birth/Sex: 07-19-1951 (64 y.o. Male) Treating RN: Primary Care Physician: Jessee Avers Other Clinician: Referring Physician: Ria Clock Treating Physician/Extender: Rudene Re in Treatment: 0 Constitutional . Pulse regular. Respirations normal and unlabored. Afebrile. . Eyes Nonicteric. Reactive to light. Ears, Nose, Mouth, and Throat Lips, teeth, and gums WNL.Marland Kitchen Moist mucosa without lesions . Neck supple and nontender. No palpable supraclavicular or cervical adenopathy. Normal sized  without goiter. Respiratory WNL. No retractions.. Cardiovascular Pedal Pulses WNL. No clubbing, cyanosis or edema. Lymphatic No adneopathy. No adenopathy. No adenopathy. Musculoskeletal Adexa without tenderness or enlargement.. Digits and nails w/o clubbing, cyanosis, infection, petechiae, ischemia, or inflammatory conditions.. Integumentary (Hair, Skin) No suspicious lesions. No crepitus or fluctuance. No peri-wound warmth or erythema. No masses.Marland Kitchen Psychiatric Judgement and insight Intact.. No evidence of depression, anxiety, or agitation.. Notes He has an open wound on the palmar aspect of the tip of his left index finger and there is significant slough which need sharp debridement with a forcep and scissors. Once this was done the wound does not probe down to bone. There is lack of sensation in the distal part of the index finger and he is not able to flex his PIP joints. Electronic Signature(s) Signed: 04/04/2015 11:24:39 AM By: Evlyn Kanner MD, FACS Entered By: Evlyn Kanner on 04/04/2015 11:24:38 Dwayne Moreno ([id number]) -------------------------------------------------------------------------------- Physician Orders Details Patient Name: Dwayne Moreno Date of Service: 04/04/2015 10:15 AM Medical Record Number: [id number] Patient Account Number: [id number] Date of Birth/Sex: Sep 25, 1950 (64 y.o. Male) Treating RN: Huel Coventry Primary Care Physician: Jessee Avers Other Clinician: Referring Physician: Ria Clock Treating Physician/Extender: Rudene Re in Treatment: 0 Verbal / Phone Orders: Yes Clinician: Huel Coventry Read Back and Verified: Yes Diagnosis Coding Wound Cleansing Wound #1 Left Hand - 2nd Digit o Clean wound with Normal Saline. Anesthetic Wound #1 Left Hand - 2nd Digit o Topical Lidocaine 4% cream applied to wound bed prior to debridement Primary Wound Dressing Wound #1 Left Hand - 2nd Digit o Prisma Ag Secondary Dressing Wound #1 Left Hand -  2nd Digit o Gauze and Kerlix/Conform - stretch net Dressing Change Frequency Wound #1 Left Hand - 2nd Digit o Change dressing every day. Follow-up Appointments Wound #1 Left Hand - 2nd Digit o Return Appointment in 1 week. Additional Orders / Instructions Wound #1 Left Hand - 2nd Digit o Increase protein intake. o Activity as tolerated Medications-please add to medication list. Wound #1 Left Hand - 2nd Digit o P.O. Antibiotics - continue antibiotics GEARY, RUFO ([id number]) o General Surgery - Ortho referral oooo Electronic Signature(s) Signed:  04/04/2015 1:11:05 PM By: Evlyn Kanner MD, FACS Signed: 04/04/2015 5:20:17 PM By: Elliot Gurney RN, BSN, Kim RN, BSN Entered By: Elliot Gurney, RN, BSN, Kim on 04/04/2015 11:05:27 Dwayne Moreno ([id number]) -------------------------------------------------------------------------------- Problem List Details Patient Name: Dwayne Moreno Date of Service: 04/04/2015 10:15 AM Medical Record Number: [id number] Patient Account Number: [id number] Date of Birth/Sex: 1950/10/19 (64 y.o. Male) Treating RN: Primary Care Physician: Jessee Avers Other Clinician: Referring Physician: Ria Clock Treating Physician/Extender: Rudene Re in Treatment: 0 Active Problems ICD-10 Encounter Code Description Active Date Diagnosis S61.211A Laceration without foreign body of left index finger without 04/04/2015 Yes damage to nail, initial encounter T81.31XA Disruption of external operation (surgical) wound, not 04/04/2015 Yes elsewhere classified, initial encounter Inactive Problems Resolved Problems Electronic Signature(s) Signed: 04/04/2015 11:10:16 AM By: Evlyn Kanner MD, FACS Entered By: Evlyn Kanner on 04/04/2015 11:10:16 Dwayne Moreno ([id number]) -------------------------------------------------------------------------------- Progress Note Details Patient Name: Dwayne Moreno Date of Service: 04/04/2015 10:15 AM Medical Record Number:  [id number] Patient Account Number: [id number] Date of Birth/Sex: Feb 19, 1951 (64 y.o. Male) Treating RN: Primary Care Physician: Jessee Avers Other Clinician: Referring Physician: Ria Clock Treating Physician/Extender: Rudene Re in Treatment: 0 Subjective Chief Complaint Information obtained from Patient Patient presents to the wound care center for a consult due non healing wound. 64 year old gentleman who had an injury to his left index finger with a table saw on 03/07/2015. History of Present Illness (HPI) The following HPI elements were documented for the patient's wound: Location: injury to the palmar aspect of the left index finger Quality: Patient reports experiencing a dull pain to affected area(s). Severity: Patient states wound are getting worse. Duration: Patient has had the wound for < 4 weeks prior to presenting for treatment Timing: Pain in wound is Intermittent (comes and goes Context: The wound occurred when the patient cut himself with a table saw while working Modifying Factors: Consults to this date include: urgent care and ER at [facility] Associated Signs and Symptoms: Patient reports having: numbness in the tip of the finger and difficulty in flexing his finger at all joints. The patient had a injury with a table saw on 03/07/2015 and was seen in the ER. He was sent to [facility] for a orthopedic opinion and seek further care there. The wound was loosely sutured in the ER here and was sent for further care to [facility]. He was seen at [facility] and after discussing with the ER physicians who spoke to the plastic surgeons they recommended no further intervention at that stage. He was given an IV antibiotic and some tetanus toxoid. The sutures were removed subsequently in 2 weeks at the Urgent care and the patient was previously on Septra and was recently changed over to Keflex. He was referred to [place] for poor wound healing and open fracture of  the distal phalanx of second finger of the left hand. X-ray of the left hand done on 03/07/2015 -- IMPRESSION:Open fracture involving the distal phalanx of the index finger as described. Small associated foreign bodies difficult to exclude. Repeat x-ray done on 03/28/2015 - IMPRESSION:Cortical defect of the volar aspect of the left second finger distal phalanx once again noted. Adjacent radiopaque material which may represent displaced ossific fragments. Adjacent soft tissue defect. The patient reports to have less sensation in the tip of his finger and is unable to bend the finger as before. He has minimal pain in this area. He has not seen a hand surgeon, plastic surgeon or a orthopedic surgeon since the injury. Wound History  Patient presents with 1 open wound that has been present for approximately 4 weeks. Patient has been Kappes, Davaris ([id number]) treating wound in the following manner: triple antibiotic ointment and gauze. Laboratory tests have not been performed in the last month. Patient reportedly has not tested positive for an antibiotic resistant organism. Patient reportedly has not tested positive for osteomyelitis. Patient experiences the following problems associated with their wounds: infection, swelling. Patient History Information obtained from Patient. Allergies penicillin, latex (Reaction: swelling) Family History Cancer - Mother, Siblings, Heart Disease - Father, Hypertension - Mother, Father, Siblings, Kidney Disease - Siblings, Lung Disease - Siblings, Stroke - Mother, Thyroid Problems - Mother, Siblings, No family history of Diabetes, Seizures. Social History Never smoker, Marital Status - Married, Alcohol Use - Moderate, Drug Use - No History, Caffeine Use - Daily. Medical History Eyes Denies history of Cataracts, Glaucoma Ear/Nose/Mouth/Throat Patient has history of Chronic sinus problems/congestion Denies history of Middle ear  problems Hematologic/Lymphatic Denies history of Anemia, Hemophilia, Human Immunodeficiency Virus, Lymphedema, Sickle Cell Disease Cardiovascular Patient has history of Arrhythmia, Hypertension Denies history of Angina, Congestive Heart Failure, Coronary Artery Disease, Deep Vein Thrombosis, Hypotension, Myocardial Infarction, Peripheral Arterial Disease, Peripheral Venous Disease, Phlebitis, Vasculitis Gastrointestinal Denies history of Cirrhosis , Colitis, Crohn s, Hepatitis A Endocrine Denies history of Type I Diabetes, Type II Diabetes Genitourinary Denies history of End Stage Renal Disease Immunological Denies history of Lupus Erythematosus, Raynaud s, Scleroderma Integumentary (Skin) Denies history of History of Burn, History of pressure wounds Musculoskeletal Patient has history of Osteoarthritis - thumbs Denies history of Gout, Rheumatoid Arthritis, Osteomyelitis Neurologic Denies history of Dementia, Neuropathy, Quadriplegia, Paraplegia, Seizure Disorder Oncologic Denies history of Received Chemotherapy, Received Radiation LONZA, SHIMABUKURO ([id number]) Psychiatric Denies history of Anorexia/bulimia, Confinement Anxiety Medical And Surgical History Notes Constitutional Symptoms (General Health) HTN, High cholesterol, BPH, Review of Systems (ROS) Constitutional Symptoms (General Health) Complains or has symptoms of Fatigue. Denies complaints or symptoms of Fever, Chills, Marked Weight Change. Eyes The patient has no complaints or symptoms. Ear/Nose/Mouth/Throat The patient has no complaints or symptoms. Hematologic/Lymphatic Complains or has symptoms of Bleeding / Clotting Disorders. Respiratory The patient has no complaints or symptoms. Cardiovascular The patient has no complaints or symptoms. Gastrointestinal Complains or has symptoms of Frequent diarrhea. Denies complaints or symptoms of Nausea, Vomiting. Endocrine The patient has no complaints or  symptoms. Genitourinary The patient has no complaints or symptoms. Immunological The patient has no complaints or symptoms. Integumentary (Skin) Complains or has symptoms of Wounds, Bleeding or bruising tendency. Denies complaints or symptoms of Breakdown, Swelling. Musculoskeletal Denies complaints or symptoms of Muscle Pain, Muscle Weakness. Neurologic The patient has no complaints or symptoms. Oncologic The patient has no complaints or symptoms, basal cell skin cancer Psychiatric The patient has no complaints or symptoms. Medications Keflex saw palmetto oral 1 1 tablet oral for four times daily saw palmetto oral 1 1 tablet oral for four times daily saw palmetto oral 1 1 tablet oral Coberly, Gabino ([id number]) sulfamethoxazole 800 mg-trimethoprim 160 mg tablet oral 1 1 tablet oral aspirin 81 mg tablet,delayed release oral 1 1 tablet,delayed release (DR/EC) oral fexofenadine 180 mg tablet oral 1 1 tablet oral atorvastatin 10 mg tablet oral 1 1 tablet oral glucosamine HCl 1,500 mg tablet oral 1 1 tablet oral alfuzosin ER 10 mg tablet,extended release 24 hr oral 1 1 tablet extended release 24 hr oral Cartia XT 240 mg capsule,extended release oral 1 1 capsule,extended release 24hr oral cephalexin 500 mg tablet oral 1 1 tablet oral  Fish Oil 360 mg-1,200 mg capsule oral 1 1 capsule oral fluticasone 50 mcg/actuation nasal spray,suspension nasal 1 1 spray,suspension nasal diclofenac sodium 75 mg tablet,delayed release oral 1 1 tablet,delayed release (DR/EC) oral potassium gluconate ER 595 mg (99 mg) tablet,extended release oral 1 1 tablet extended release oral hydrochlorothiazide 12.5 mg tablet oral 1 1 tablet oral mupirocin 2 % topical ointment topical ointment topical Vitamin D3 1,000 unit capsule oral 1 1 capsule oral zinc 50 mg tablet oral 1 1 tablet oral Objective Constitutional Pulse regular. Respirations normal and unlabored. Afebrile. Vitals Time Taken: 10:30 AM, Height: 69  in, Source: Stated, Weight: 139 lbs, Source: Stated, BMI: 20.5, Temperature: 98.0 F, Pulse: 63 bpm, Respiratory Rate: 18 breaths/min, Blood Pressure: 129/82 mmHg. Eyes Nonicteric. Reactive to light. Ears, Nose, Mouth, and Throat Lips, teeth, and gums WNL.Marland Kitchen Moist mucosa without lesions . Neck supple and nontender. No palpable supraclavicular or cervical adenopathy. Normal sized without goiter. Respiratory WNL. No retractions.. Cardiovascular Pedal Pulses WNL. No clubbing, cyanosis or edema. Lymphatic No adneopathy. No adenopathy. No adenopathy. Musculoskeletal Adexa without tenderness or enlargement.. Digits and nails w/o clubbing, cyanosis, infection, petechiae, Pelzel, Chrles ([id number]) ischemia, or inflammatory conditions.Marland Kitchen Psychiatric Judgement and insight Intact.. No evidence of depression, anxiety, or agitation.. General Notes: He has an open wound on the palmar aspect of the tip of his left index finger and there is significant slough which need sharp debridement with a forcep and scissors. Once this was done the wound does not probe down to bone. There is lack of sensation in the distal part of the index finger and he is not able to flex his PIP joints. Integumentary (Hair, Skin) No suspicious lesions. No crepitus or fluctuance. No peri-wound warmth or erythema. No masses.. Wound #1 status is Open. Original cause of wound was Trauma. The wound is located on the Left Hand - 2nd Digit. The wound measures 0.2cm length x 2cm width x 0.1cm depth; 0.314cm^2 area and 0.031cm^3 volume. The wound is limited to skin breakdown. There is no tunneling or undermining noted. There is a small amount of serous drainage noted. The wound margin is indistinct and nonvisible. There is small (1-33%) red granulation within the wound bed. There is a large (67-100%) amount of necrotic tissue within the wound bed including Eschar and Adherent Slough. The periwound skin appearance exhibited:  Localized Edema, Moist, Ecchymosis. The periwound skin appearance did not exhibit: Callus, Crepitus, Excoriation, Fluctuance, Friable, Induration, Rash, Scarring, Dry/Scaly, Maceration, Atrophie Blanche, Cyanosis, Hemosiderin Staining, Mottled, Pallor, Rubor, Erythema. Assessment Active Problems ICD-10 N56.213Y - Laceration without foreign body of left index finger without damage to nail, initial encounter T81.31XA - Disruption of external operation (surgical) wound, not elsewhere classified, initial encounter After debriding his wound I have recommended silver collagen to be applied on a daily basis with an appropriate dressing. Have asked him to continue with his course of antibiotics. He should obtain a consultation with a hand surgeon, plastic surgeon or a orthopedic surgeon as soon as possible so that he gets proper rehabilitation and advice regarding whether debridement in the OR with a local flap reconstruction is required. He will follow-up with me on a regular basis unless he has surgery and then he can follow-up with his surgeon. All questions answered and the patient has had a Pensions consultant in the room taking notes during my visit. Procedures Feazell, Harnoor ([id number]) Wound #1 Wound #1 is a Trauma, Other located on the Left Hand - 2nd Digit . There was a Skin/Subcutaneous  Tissue Debridement ([id number]) debridement with total area of 0.4 sq cm performed by Corneluis Allston, Ignacia Felling., MD. with the following instrument(s): Forceps and Scissors to remove Viable and Non-Viable tissue/material including Exudate, Fibrin/Slough, Eschar, and Subcutaneous after achieving pain control using Other (lidocaine 4%). A time out was conducted prior to the start of the procedure. A Minimum amount of bleeding was controlled with Pressure. The procedure was tolerated well with a pain level of 2 throughout and a pain level of 3 following the procedure. Post Debridement Measurements: 0.4cm  length x 2cm width x 0.3cm depth; 0.188cm^3 volume. Plan Wound Cleansing: Wound #1 Left Hand - 2nd Digit: Clean wound with Normal Saline. Anesthetic: Wound #1 Left Hand - 2nd Digit: Topical Lidocaine 4% cream applied to wound bed prior to debridement Primary Wound Dressing: Wound #1 Left Hand - 2nd Digit: Prisma Ag Secondary Dressing: Wound #1 Left Hand - 2nd Digit: Gauze and Kerlix/Conform - stretch net Dressing Change Frequency: Wound #1 Left Hand - 2nd Digit: Change dressing every day. Follow-up Appointments: Wound #1 Left Hand - 2nd Digit: Return Appointment in 1 week. Additional Orders / Instructions: Wound #1 Left Hand - 2nd Digit: Increase protein intake. Activity as tolerated Medications-please add to medication list.: Wound #1 Left Hand - 2nd Digit: P.O. Antibiotics - continue antibiotics Consults ordered were: General Surgery - Ortho referral Kovalenko, Jayceon ([id number]) After debriding his wound I have recommended silver collagen to be applied on a daily basis with an appropriate dressing. Have asked him to continue with his course of antibiotics. He should obtain a consultation with a hand surgeon, plastic surgeon or a orthopedic surgeon as soon as possible so that he gets proper rehabilitation and advice regarding whether debridement in the OR with a local flap reconstruction is required. He will follow-up with me on a regular basis unless he has surgery and then he can follow-up with his surgeon. All questions answered and the patient has had a Pensions consultant in the room taking notes during my visit. Electronic Signature(s) Signed: 04/04/2015 11:27:50 AM By: Evlyn Kanner MD, FACS Entered By: Evlyn Kanner on 04/04/2015 11:27:50 Dwayne Moreno ([id number]) -------------------------------------------------------------------------------- ROS/PFSH Details Patient Name: Dwayne Moreno Date of Service: 04/04/2015 10:15 AM Medical Record Number:  [id number] Patient Account Number: [id number] Date of Birth/Sex: [birth year] (64 y.o. Male) Treating RN: Huel Coventry Primary Care Physician: Jessee Avers Other Clinician: Referring Physician: Ria Clock Treating Physician/Extender: Rudene Re in Treatment: 0 Information Obtained From Patient Wound History Do you currently have one or more open woundso Yes How many open wounds do you currently haveo 1 Approximately how long have you had your woundso 4 weeks How have you been treating your wound(s) until nowo triple antibiotic ointment and gauze Has your wound(s) ever healed and then re-openedo No Have you had any lab work done in the past montho No Have you tested positive for an antibiotic resistant organism No (MRSA, VRE)o Have you tested positive for osteomyelitis (bone infection)o No Have you had other problems associated with your woundso Infection, Swelling Constitutional Symptoms (General Health) Complaints and Symptoms: Positive for: Fatigue Negative for: Fever; Chills; Marked Weight Change Medical History: Past Medical History Notes: HTN, High cholesterol, BPH, Hematologic/Lymphatic Complaints and Symptoms: Positive for: Bleeding / Clotting Disorders Medical History: Negative for: Anemia; Hemophilia; Human Immunodeficiency Virus; Lymphedema; Sickle Cell Disease Gastrointestinal Complaints and Symptoms: Positive for: Frequent diarrhea Negative for: Nausea; Vomiting Medical History: Negative for: Cirrhosis ; Colitis; Crohnos; Hepatitis A Renderos, Kaidence ([id number]) Endocrine Complaints and Symptoms: No  Complaints or Symptoms Complaints and Symptoms: Negative for: Hepatitis; Thyroid disease; Polydypsia (Excessive Thirst) Medical History: Negative for: Type I Diabetes; Type II Diabetes Genitourinary Complaints and Symptoms: No Complaints or Symptoms Complaints and Symptoms: Negative for: Kidney failure/ Dialysis Medical History: Negative for: End Stage  Renal Disease Immunological Complaints and Symptoms: No Complaints or Symptoms Complaints and Symptoms: Negative for: Hives; Itching Medical History: Negative for: Lupus Erythematosus; Raynaudos; Scleroderma Integumentary (Skin) Complaints and Symptoms: Positive for: Wounds; Bleeding or bruising tendency Negative for: Breakdown; Swelling Medical History: Negative for: History of Burn; History of pressure wounds Musculoskeletal Complaints and Symptoms: Negative for: Muscle Pain; Muscle Weakness Medical History: Positive for: Osteoarthritis - thumbs Negative for: Gout; Rheumatoid Arthritis; Osteomyelitis Psychiatric Lansky, [name] ([id number]) Complaints and Symptoms: No Complaints or Symptoms Complaints and Symptoms: Negative for: Anxiety; Claustrophobia Medical History: Negative for: Anorexia/bulimia; Confinement Anxiety Eyes Complaints and Symptoms: No Complaints or Symptoms Medical History: Negative for: Cataracts; Glaucoma Ear/Nose/Mouth/Throat Complaints and Symptoms: No Complaints or Symptoms Medical History: Positive for: Chronic sinus problems/congestion Negative for: Middle ear problems Respiratory Complaints and Symptoms: No Complaints or Symptoms Cardiovascular Complaints and Symptoms: No Complaints or Symptoms Medical History: Positive for: Arrhythmia; Hypertension Negative for: Angina; Congestive Heart Failure; Coronary Artery Disease; Deep Vein Thrombosis; Hypotension; Myocardial Infarction; Peripheral Arterial Disease; Peripheral Venous Disease; Phlebitis; Vasculitis Neurologic Complaints and Symptoms: No Complaints or Symptoms Medical History: Negative for: Dementia; Neuropathy; Quadriplegia; Paraplegia; Seizure Disorder Oncologic SALAR, MOLDEN ([id number]) Complaints and Symptoms: No Complaints or Symptoms Complaints and Symptoms: Review of System Notes: basal cell skin cancer Medical History: Negative for: Received Chemotherapy; Received  Radiation HBO Extended History Items Ear/Nose/Mouth/Throat: Chronic sinus problems/congestion Immunizations Tetanus Vaccine: Last tetanus shot: 03/08/2015 Family and Social History Cancer: Yes - Mother, Siblings; Diabetes: No; Heart Disease: Yes - Father; Hypertension: Yes - Mother, Father, Siblings; Kidney Disease: Yes - Siblings; Lung Disease: Yes - Siblings; Seizures: No; Stroke: Yes - Mother; Thyroid Problems: Yes - Mother, Siblings; Never smoker; Marital Status - Married; Alcohol Use: Moderate; Drug Use: No History; Caffeine Use: Daily; Living Will: Yes (Not Provided); Medical Power of Attorney: Yes (Not Provided) Physician Affirmation I have reviewed and agree with the above information. Electronic Signature(s) Signed: 04/04/2015 11:25:11 AM By: Evlyn Kanner MD, FACS Signed: 04/04/2015 5:20:17 PM By: Elliot Gurney RN, BSN, Kim RN, BSN Entered By: Evlyn Kanner on 04/04/2015 11:25:10 Dwayne Moreno ([id number]) -------------------------------------------------------------------------------- SuperBill Details Patient Name: Dwayne Moreno Date of Service: 04/04/2015 Medical Record Number: [id number] Patient Account Number: [id number] Date of Birth/Sex: 1950-10-08 (64 y.o. Male) Treating RN: Primary Care Physician: Jessee Avers Other Clinician: Referring Physician: Ria Clock Treating Physician/Extender: Rudene Re in Treatment: 0 Diagnosis Coding ICD-10 Codes Code Description Laceration without foreign body of left index finger without damage to nail, initial S61.211A encounter Disruption of external operation (surgical) wound, not elsewhere classified, initial T81.31XA encounter Facility Procedures CPT4: Description Modifier Quantity Code 24401027 99213 - WOUND CARE VISIT-LEV 3 EST PT 1 CPT4: 25366440 11042 - DEB SUBQ TISSUE 20 SQ CM/< 1 ICD-10 Description Diagnosis S61.211A Laceration without foreign body of left index finger without damage to nail, initial encounter  T81.31XA Disruption of external operation (surgical) wound, not  elsewhere classified, initial encounter Physician Procedures CPT4: Description Modifier Quantity Code 3474259 99204 - WC PHYS LEVEL 4 - NEW PT 1 ICD-10 Description Diagnosis S61.211A Laceration without foreign body of left index finger without damage to nail, initial encounter T81.31XA Disruption of external  operation (surgical) wound, not elsewhere classified, initial encounter CPT4: 5638756 11042 - WC PHYS  SUBQ TISS 20 SQ CM 1 ICD-10 Description Diagnosis S61.211A Laceration without foreign body of left index finger without damage to nail, initial encounter ALEXY, HELDT ([id number]) Electronic Signature(s) Signed: 04/04/2015 11:28:07 AM By: Evlyn Kanner MD, FACS Entered By: Evlyn Kanner on 04/04/2015 11:28:07
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
## 2015-04-05 NOTE — Progress Notes (Signed)
Dwayne Moreno, Dwayne Moreno ([id number]) Visit Report for 04/04/2015 Abuse/Suicide Risk Screen Details Patient Name: Dwayne Moreno, Dwayne Moreno Date of Service: 04/04/2015 10:15 AM Medical Record Number: [id number] Patient Account Number: [id number] Date of Birth/Sex: 19-May-1951 (64 y.o. Male) Treating RN: Huel Coventry Primary Care Physician: Jessee Avers Other Clinician: Referring Physician: Ria Clock Treating Physician/Extender: Rudene Re in Treatment: 0 Abuse/Suicide Risk Screen Items Answer ABUSE/SUICIDE RISK SCREEN: Has anyone close to you tried to hurt or harm you recentlyo No Do you feel uncomfortable with anyone in your familyo No Has anyone forced you do things that you didnot want to doo No Do you have any thoughts of harming yourselfo No Patient displays signs or symptoms of abuse and/or neglect. No Electronic Signature(s) Signed: 04/04/2015 5:20:17 PM By: Elliot Gurney, RN, BSN, Kim RN, BSN Entered By: Elliot Gurney, RN, BSN, Kim on 04/04/2015 10:38:50 Dwayne Moreno ([id number]) -------------------------------------------------------------------------------- Activities of Daily Living Details Patient Name: Dwayne Moreno Date of Service: 04/04/2015 10:15 AM Medical Record Number: [id number] Patient Account Number: [id number] Date of Birth/Sex: Dec 15, 1950 (64 y.o. Male) Treating RN: Huel Coventry Primary Care Physician: Jessee Avers Other Clinician: Referring Physician: Ria Clock Treating Physician/Extender: Rudene Re in Treatment: 0 Activities of Daily Living Items Answer Activities of Daily Living (Please select one for each item) Drive Automobile Completely Able Take Medications Completely Able Use Telephone Completely Able Care for Appearance Completely Able Use Toilet Completely Able Bath / Shower Completely Able Dress Self Completely Able Feed Self Completely Able Walk Completely Able Get In / Out Bed Completely Able Housework Completely Able Prepare Meals Completely Able Handle  Money Completely Able Shop for Self Completely Able Electronic Signature(s) Signed: 04/04/2015 5:20:17 PM By: Elliot Gurney, RN, BSN, Kim RN, BSN Entered By: Elliot Gurney, RN, BSN, Kim on 04/04/2015 10:39:03 Dwayne Moreno ([id number]) -------------------------------------------------------------------------------- Education Assessment Details Patient Name: Dwayne Moreno Date of Service: 04/04/2015 10:15 AM Medical Record Number: [id number] Patient Account Number: [id number] Date of Birth/Sex: 1951-01-29 (64 y.o. Male) Treating RN: Huel Coventry Primary Care Physician: Jessee Avers Other Clinician: Referring Physician: Ria Clock Treating Physician/Extender: Rudene Re in Treatment: 0 Learning Preferences/Education Level/Primary Language Learning Preference: Explanation, Demonstration, Printed Material Highest Education Level: College or Above Preferred Language: English Cognitive Barrier Assessment/Beliefs Language Barrier: No Translator Needed: No Memory Deficit: No Cultural/Religious Beliefs Affecting Medical No Care: Physical Barrier Assessment Impaired Vision: Yes Glasses Impaired Hearing: No Knowledge/Comprehension Assessment Knowledge Level: High Comprehension Level: High Ability to understand written High instructions: Ability to understand verbal High instructions: Motivation Assessment Anxiety Level: Calm Cooperation: Cooperative Education Importance: Acknowledges Need Interest in Health Problems: Asks Questions Perception: Coherent Willingness to Engage in Self- High Management Activities: Readiness to Engage in Self- High Management Activities: Psychologist, prison and probation services) Signed: 04/04/2015 5:20:17 PM By: Elliot Gurney, RN, BSN, Kim RN, BSN Entered By: Elliot Gurney, RN, BSN, Kim on 04/04/2015 10:39:41 Dwayne Moreno ([id number]) Dwayne Moreno ([id number]) -------------------------------------------------------------------------------- Fall Risk Assessment Details Patient Name: Dwayne Moreno Date of Service: 04/04/2015 10:15 AM Medical Record Number: [id number] Patient Account Number: [id number] Date of Birth/Sex: 11/17/1950 (64 y.o. Male) Treating RN: Huel Coventry Primary Care Physician: Jessee Avers Other Clinician: Referring Physician: Ria Clock Treating Physician/Extender: Rudene Re in Treatment: 0 Fall Risk Assessment Items FALL RISK ASSESSMENT: History of falling - immediate or within 3 months 0 No Secondary diagnosis 0 No Ambulatory aid None/bed rest/wheelchair/nurse 0 Yes Crutches/cane/walker 0 No Furniture 0 No IV Access/Saline Lock 0 No Gait/Training Normal/bed rest/immobile 0 Yes Weak 0 No Impaired 0 No Mental Status Oriented to own ability 0 Yes Electronic Signature(s) Signed:  04/04/2015 5:20:17 PM By: Elliot Gurney, RN, BSN, Kim RN, BSN Entered By: Elliot Gurney, RN, BSN, Kim on 04/04/2015 10:40:00 Dwayne Moreno, Dwayne Moreno ([id number]) -------------------------------------------------------------------------------- Nutrition Risk Assessment Details Patient Name: Dwayne Moreno Date of Service: 04/04/2015 10:15 AM Medical Record Number: [id number] Patient Account Number: [id number] Date of Birth/Sex: [birth year] (64 y.o. Male) Treating RN: Huel Coventry Primary Care Physician: Jessee Avers Other Clinician: Referring Physician: Ria Clock Treating Physician/Extender: Rudene Re in Treatment: 0 Height (in): 69 Weight (lbs): 139 Body Mass Index (BMI): 20.5 Nutrition Risk Assessment Items NUTRITION RISK SCREEN: I have an illness or condition that made me change the kind and/or 0 No amount of food I eat I eat fewer than two meals per day 0 No I eat few fruits and vegetables, or milk products 0 No I have three or more drinks of beer, liquor or wine almost every day 0 No I have tooth or mouth problems that make it hard for me to eat 0 No I don't always have enough money to buy the food I need 0 No I eat alone most of the time 0 No I take three or more  different prescribed or over-the-counter drugs a 0 No day Without wanting to, I have lost or gained 10 pounds in the last six 0 No months I am not always physically able to shop, cook and/or feed myself 0 No Nutrition Protocols Good Risk Protocol 0 No interventions needed Moderate Risk Protocol Electronic Signature(s) Signed: 04/04/2015 5:20:17 PM By: Elliot Gurney, RN, BSN, Kim RN, BSN Entered By: Elliot Gurney, RN, BSN, Kim on 04/04/2015 10:40:14

## 2015-04-11 ENCOUNTER — Encounter: Payer: BLUE CROSS/BLUE SHIELD | Admitting: Surgery

## 2015-04-11 DIAGNOSIS — S61211A Laceration without foreign body of left index finger without damage to nail, initial encounter: Secondary | ICD-10-CM | POA: Diagnosis not present

## 2015-04-12 NOTE — Progress Notes (Signed)
DARTANYON, FRANKOWSKI ([id number]) Visit Report for 04/11/2015 Chief Complaint Document Details Patient Name: Dwayne Moreno, Dwayne Moreno Date of Service: 04/11/2015 9:30 AM Medical Record Number: [id number] Patient Account Number: [id number] Date of Birth/Sex: 21-Aug-1951 (64 y.o. Male) Treating RN: Curtis Sites Primary Care Physician: Jessee Avers Other Clinician: Referring Physician: Jessee Avers Treating Physician/Extender: Rudene Re in Treatment: 1 Information Obtained from: Patient Chief Complaint Patient presents to the wound care center for a consult due non healing wound. 64 year old gentleman who had an injury to his left index finger with a table saw on 03/07/2015. Electronic Signature(s) Signed: 04/11/2015 10:30:54 AM By: Evlyn Kanner MD, FACS Entered By: Evlyn Kanner on 04/11/2015 10:30:54 Dwayne Moreno ([id number]) -------------------------------------------------------------------------------- Debridement Details Patient Name: Dwayne Moreno Date of Service: 04/11/2015 9:30 AM Medical Record Number: [id number] Patient Account Number: [id number] Date of Birth/Sex: 05-01-1951 (64 y.o. Male) Treating RN: Curtis Sites Primary Care Physician: Jessee Avers Other Clinician: Referring Physician: Jessee Avers Treating Physician/Extender: Rudene Re in Treatment: 1 Debridement Performed for Wound #1 Left Hand - 2nd Digit Assessment: Performed By: Physician Tristan Schroeder., MD Debridement: Open Wound/Selective Debridement Selective Description: Pre-procedure Yes Verification/Time Out Taken: Start Time: 10:22 Pain Control: Other : lidocaine 4% Level: Non-Viable Tissue Total Area Debrided (L x 1.6 (cm) x 0.2 (cm) = 0.32 (cm) W): Tissue and other Non-Viable, Eschar, Exudate, Fibrin/Slough, Skin material debrided: Instrument: Scissors, Other : gauze Bleeding: Minimum Hemostasis Achieved: Pressure End Time: 10:25 Procedural Pain: 0 Post Procedural Pain:  0 Response to Treatment: Procedure was tolerated well Post Debridement Measurements of Total Wound Length: (cm) 1.6 Width: (cm) 0.2 Depth: (cm) 0.1 Volume: (cm) 0.025 Electronic Signature(s) Signed: 04/11/2015 10:30:47 AM By: Evlyn Kanner MD, FACS Signed: 04/11/2015 5:54:31 PM By: Curtis Sites Entered By: Evlyn Kanner on 04/11/2015 10:30:47 Dwayne Moreno ([id number]) -------------------------------------------------------------------------------- HPI Details Patient Name: Dwayne Moreno Date of Service: 04/11/2015 9:30 AM Medical Record Number: [id number] Patient Account Number: [id number] Date of Birth/Sex: 03/11/1951 (64 y.o. Male) Treating RN: Curtis Sites Primary Care Physician: Jessee Avers Other Clinician: Referring Physician: Jessee Avers Treating Physician/Extender: Rudene Re in Treatment: 1 History of Present Illness Location: injury to the palmar aspect of the left index finger Quality: Patient reports experiencing a dull pain to affected area(s). Severity: Patient states wound are getting worse. Duration: Patient has had the wound for < 4 weeks prior to presenting for treatment Timing: Pain in wound is Intermittent (comes and goes Context: The wound occurred when the patient cut himself with a table saw while working Modifying Factors: Consults to this date include: urgent care and ER at [facility] Associated Signs and Symptoms: Patient reports having: numbness in the tip of the finger and difficulty in flexing his finger at all joints. HPI Description: The patient had a injury with a table saw on 03/07/2015 and was seen in the ER. He was sent to [facility] for a orthopedic opinion and seek further care there. The wound was loosely sutured in the ER here and was sent for further care to [facility]. He was seen at [facility] and after discussing with the ER physicians who spoke to the plastic surgeons they recommended no further intervention at that  stage. He was given an IV antibiotic and some tetanus toxoid. The sutures were removed subsequently in 2 weeks at the Urgent care and the patient was previously on Septra and was recently changed over to Keflex. He was referred to [place] for poor wound healing and open fracture of the distal phalanx of second finger  of the left hand. X-ray of the left hand done on 03/07/2015 -- IMPRESSION:Open fracture involving the distal phalanx of the index finger as described. Small associated foreign bodies difficult to exclude. Repeat x-ray done on 03/28/2015 - IMPRESSION:Cortical defect of the volar aspect of the left second finger distal phalanx once again noted. Adjacent radiopaque material which may represent displaced ossific fragments. Adjacent soft tissue defect. The patient reports to have less sensation in the tip of his finger and is unable to bend the finger as before. He has minimal pain in this area. He has not seen a hand surgeon, plastic surgeon or a orthopedic surgeon since the injury. 04/11/2015 -- he has completed his course of antibiotics and has an appointment to see a plastic and reconstructive surgeon at [facility] this week. Electronic Signature(s) Signed: 04/11/2015 10:31:49 AM By: Evlyn Kanner MD, FACS Previous Signature: 04/11/2015 10:31:24 AM Version By: Evlyn Kanner MD, FACS Entered By: Evlyn Kanner on 04/11/2015 10:31:48 Dwayne Moreno ([id number]) -------------------------------------------------------------------------------- Physical Exam Details Patient Name: Dwayne Moreno Date of Service: 04/11/2015 9:30 AM Medical Record Number: [id number] Patient Account Number: [id number] Date of Birth/Sex: [birth year] (64 y.o. Male) Treating RN: Curtis Sites Primary Care Physician: Jessee Avers Other Clinician: Referring Physician: Jessee Avers Treating Physician/Extender: Rudene Re in Treatment: 1 Constitutional . Pulse regular. Respirations normal and unlabored.  Afebrile. . Eyes Nonicteric. Reactive to light. Ears, Nose, Mouth, and Throat Lips, teeth, and gums WNL.Marland Kitchen Moist mucosa without lesions . Neck supple and nontender. No palpable supraclavicular or cervical adenopathy. Normal sized without goiter. Respiratory WNL. No retractions.. Breath sounds WNL, No rubs, rales, rhonchi, or wheeze.. Cardiovascular Heart rhythm and rate regular, no murmur or gallop.. Pedal Pulses WNL. No clubbing, cyanosis or edema. Lymphatic No adneopathy. No adenopathy. No adenopathy. Musculoskeletal Adexa without tenderness or enlargement.. Digits and nails w/o clubbing, cyanosis, infection, petechiae, ischemia, or inflammatory conditions.. Integumentary (Hair, Skin) No suspicious lesions. No crepitus or fluctuance. No peri-wound warmth or erythema. No masses.Marland Kitchen Psychiatric Judgement and insight Intact.. No evidence of depression, anxiety, or agitation.. Notes the left index finger has some debris which needs Dissection and this has been done with the forceps and scissors. Electronic Signature(s) Signed: 04/11/2015 10:32:19 AM By: Evlyn Kanner MD, FACS Entered By: Evlyn Kanner on 04/11/2015 10:32:19 Dwayne Moreno ([id number]) -------------------------------------------------------------------------------- Physician Orders Details Patient Name: Dwayne Moreno Date of Service: 04/11/2015 9:30 AM Medical Record Number: [id number] Patient Account Number: [id number] Date of Birth/Sex: 1950/11/27 (64 y.o. Male) Treating RN: Huel Coventry Primary Care Physician: Jessee Avers Other Clinician: Referring Physician: Jessee Avers Treating Physician/Extender: Rudene Re in Treatment: 1 Verbal / Phone Orders: Yes Clinician: Huel Coventry Read Back and Verified: Yes Diagnosis Coding Wound Cleansing Wound #1 Left Hand - 2nd Digit o Clean wound with Normal Saline. Anesthetic Wound #1 Left Hand - 2nd Digit o Topical Lidocaine 4% cream applied to wound bed prior to  debridement Primary Wound Dressing Wound #1 Left Hand - 2nd Digit o Prisma Ag Secondary Dressing Wound #1 Left Hand - 2nd Digit o Gauze and Kerlix/Conform - stretch net Dressing Change Frequency Wound #1 Left Hand - 2nd Digit o Change dressing every other day. Follow-up Appointments Wound #1 Left Hand - 2nd Digit o Return Appointment in 1 week. Additional Orders / Instructions Wound #1 Left Hand - 2nd Digit o Increase protein intake. o Activity as tolerated Medications-please add to medication list. Wound #1 Left Hand - 2nd Digit o P.O. Antibiotics - continue antibiotics Dwayne Moreno, Dwayne Moreno ([id number]) Electronic Signature(s) Signed: 04/11/2015  12:25:57 PM By: Evlyn Kanner MD, FACS Signed: 04/11/2015 6:31:19 PM By: Elliot Gurney RN, BSN, Kim RN, BSN Entered By: Elliot Gurney, RN, BSN, Kim on 04/11/2015 10:27:25 Dwayne Moreno ([id number]) -------------------------------------------------------------------------------- Problem List Details Patient Name: Dwayne Moreno Date of Service: 04/11/2015 9:30 AM Medical Record Number: [id number] Patient Account Number: [id number] Date of Birth/Sex: [birth year] (64 y.o. Male) Treating RN: Curtis Sites Primary Care Physician: Jessee Avers Other Clinician: Referring Physician: Jessee Avers Treating Physician/Extender: Rudene Re in Treatment: 1 Active Problems ICD-10 Encounter Code Description Active Date Diagnosis S61.211A Laceration without foreign body of left index finger without 04/04/2015 Yes damage to nail, initial encounter T81.31XA Disruption of external operation (surgical) wound, not 04/04/2015 Yes elsewhere classified, initial encounter Inactive Problems Resolved Problems Electronic Signature(s) Signed: 04/11/2015 10:30:35 AM By: Evlyn Kanner MD, FACS Entered By: Evlyn Kanner on 04/11/2015 10:30:35 Dwayne Moreno ([id number]) -------------------------------------------------------------------------------- Progress Note  Details Patient Name: Dwayne Moreno Date of Service: 04/11/2015 9:30 AM Medical Record Number: [id number] Patient Account Number: [id number] Date of Birth/Sex: [birth year] (64 y.o. Male) Treating RN: Curtis Sites Primary Care Physician: Jessee Avers Other Clinician: Referring Physician: Jessee Avers Treating Physician/Extender: Rudene Re in Treatment: 1 Subjective Chief Complaint Information obtained from Patient Patient presents to the wound care center for a consult due non healing wound. 64 year old gentleman who had an injury to his left index finger with a table saw on 03/07/2015. History of Present Illness (HPI) The following HPI elements were documented for the patient's wound: Location: injury to the palmar aspect of the left index finger Quality: Patient reports experiencing a dull pain to affected area(s). Severity: Patient states wound are getting worse. Duration: Patient has had the wound for < 4 weeks prior to presenting for treatment Timing: Pain in wound is Intermittent (comes and goes Context: The wound occurred when the patient cut himself with a table saw while working Modifying Factors: Consults to this date include: urgent care and ER at [facility] Associated Signs and Symptoms: Patient reports having: numbness in the tip of the finger and difficulty in flexing his finger at all joints. The patient had a injury with a table saw on 03/07/2015 and was seen in the ER. He was sent to [facility] for a orthopedic opinion and seek further care there. The wound was loosely sutured in the ER here and was sent for further care to [facility]. He was seen at [facility] and after discussing with the ER physicians who spoke to the plastic surgeons they recommended no further intervention at that stage. He was given an IV antibiotic and some tetanus toxoid. The sutures were removed subsequently in 2 weeks at the Urgent care and the patient was previously  on Septra and was recently changed over to Keflex. He was referred to [place] for poor wound healing and open fracture of the distal phalanx of second finger of the left hand. X-ray of the left hand done on 03/07/2015 -- IMPRESSION:Open fracture involving the distal phalanx of the index finger as described. Small associated foreign bodies difficult to exclude. Repeat x-ray done on 03/28/2015 - IMPRESSION:Cortical defect of the volar aspect of the left second finger distal phalanx once again noted. Adjacent radiopaque material which may represent displaced ossific fragments. Adjacent soft tissue defect. The patient reports to have less sensation in the tip of his finger and is unable to bend the finger as before. He has minimal pain in this area. He has not seen a hand surgeon, plastic surgeon or a orthopedic surgeon since the  injury. 04/11/2015 -- he has completed his course of antibiotics and has an appointment to see a plastic and reconstructive surgeon at [facility] this week. Dwayne Moreno, Dwayne Moreno ([id number]) Objective Constitutional Pulse regular. Respirations normal and unlabored. Afebrile. Vitals Time Taken: 10:01 AM, Height: 69 in, Weight: 139 lbs, BMI: 20.5, Temperature: 98.3 F, Pulse: 55 bpm, Respiratory Rate: 18 breaths/min, Blood Pressure: 135/81 mmHg. Eyes Nonicteric. Reactive to light. Ears, Nose, Mouth, and Throat Lips, teeth, and gums WNL.Marland Kitchen Moist mucosa without lesions . Neck supple and nontender. No palpable supraclavicular or cervical adenopathy. Normal sized without goiter. Respiratory WNL. No retractions.. Breath sounds WNL, No rubs, rales, rhonchi, or wheeze.. Cardiovascular Heart rhythm and rate regular, no murmur or gallop.. Pedal Pulses WNL. No clubbing, cyanosis or edema. Lymphatic No adneopathy. No adenopathy. No adenopathy. Musculoskeletal Adexa without tenderness or enlargement.. Digits and nails w/o clubbing, cyanosis, infection, petechiae, ischemia, or  inflammatory conditions.Marland Kitchen Psychiatric Judgement and insight Intact.. No evidence of depression, anxiety, or agitation.. General Notes: the left index finger has some debris which needs Dissection and this has been done with the forceps and scissors. Integumentary (Hair, Skin) No suspicious lesions. No crepitus or fluctuance. No peri-wound warmth or erythema. No masses.. Wound #1 status is Open. Original cause of wound was Trauma. The wound is located on the Left Hand - 2nd Digit. The wound measures 1.6cm length x 0.2cm width x 0.1cm depth; 0.251cm^2 area and 0.025cm^3 volume. The wound is limited to skin breakdown. There is a small amount of serous drainage Dwayne Moreno, Dwayne Moreno ([id number]) noted. The wound margin is indistinct and nonvisible. There is small (1-33%) red granulation within the wound bed. There is a large (67-100%) amount of necrotic tissue within the wound bed including Eschar and Adherent Slough. The periwound skin appearance exhibited: Localized Edema, Moist, Ecchymosis. The periwound skin appearance did not exhibit: Callus, Crepitus, Excoriation, Fluctuance, Friable, Induration, Rash, Scarring, Dry/Scaly, Maceration, Atrophie Blanche, Cyanosis, Hemosiderin Staining, Mottled, Pallor, Rubor, Erythema. Assessment Active Problems ICD-10 G64.403K - Laceration without foreign body of left index finger without damage to nail, initial encounter T81.31XA - Disruption of external operation (surgical) wound, not elsewhere classified, initial encounter We will continue local care with Prisma Ag on alternate dates and an appropriate bandage. He will keep his appointment with the surgeon this coming week and we will see him back next week. Procedures Wound #1 Wound #1 is a Trauma, Other located on the Left Hand - 2nd Digit . There was a Non-Viable Tissue Open Wound/Selective [phone]) debridement with total area of 0.32 sq cm performed by Cort Dragoo, Ignacia Felling., MD. with the following  instrument(s): gauze and Scissors to remove Non-Viable tissue/material including Exudate, Fibrin/Slough, Eschar, and Skin after achieving pain control using Other (lidocaine 4%). A time out was conducted prior to the start of the procedure. A Minimum amount of bleeding was controlled with Pressure. The procedure was tolerated well with a pain level of 0 throughout and a pain level of 0 following the procedure. Post Debridement Measurements: 1.6cm length x 0.2cm width x 0.1cm depth; 0.025cm^3 volume. Plan Wound Cleansing: Wound #1 Left Hand - 2nd Digit: Clean wound with Normal Saline. Anesthetic: Dwayne Moreno, Dwayne Moreno ([id number]) Wound #1 Left Hand - 2nd Digit: Topical Lidocaine 4% cream applied to wound bed prior to debridement Primary Wound Dressing: Wound #1 Left Hand - 2nd Digit: Prisma Ag Secondary Dressing: Wound #1 Left Hand - 2nd Digit: Gauze and Kerlix/Conform - stretch net Dressing Change Frequency: Wound #1 Left Hand - 2nd Digit: Change dressing every other  day. Follow-up Appointments: Wound #1 Left Hand - 2nd Digit: Return Appointment in 1 week. Additional Orders / Instructions: Wound #1 Left Hand - 2nd Digit: Increase protein intake. Activity as tolerated Medications-please add to medication list.: Wound #1 Left Hand - 2nd Digit: P.O. Antibiotics - continue antibiotics We will continue local care with Prisma Ag on alternate dates and an appropriate bandage. He will keep his appointment with the surgeon this coming week and we will see him back next week. Electronic Signature(s) Signed: 04/11/2015 10:33:04 AM By: Evlyn Kanner MD, FACS Entered By: Evlyn Kanner on 04/11/2015 10:33:04 Dwayne Moreno ([id number]) -------------------------------------------------------------------------------- SuperBill Details Patient Name: Dwayne Moreno Date of Service: 04/11/2015 Medical Record Number: [id number] Patient Account Number: [id number] Date of Birth/Sex: 12-31-1950 (64 y.o.  Male) Treating RN: Curtis Sites Primary Care Physician: Jessee Avers Other Clinician: Referring Physician: Jessee Avers Treating Physician/Extender: Rudene Re in Treatment: 1 Diagnosis Coding ICD-10 Codes Code Description Laceration without foreign body of left index finger without damage to nail, initial S61.211A encounter Disruption of external operation (surgical) wound, not elsewhere classified, initial T81.31XA encounter Facility Procedures CPT4: Description Modifier Quantity Code [id number] [phone] - DEBRIDE WOUND 1ST 20 SQ CM OR < 1 ICD-10 Description Diagnosis S61.211A Laceration without foreign body of left index finger without damage to nail, initial encounter T81.31XA Disruption of  external operation (surgical) wound, not elsewhere classified, initial encounter Physician Procedures CPT4: Description Modifier Quantity Code [id number] - WC PHYS DEBR WO ANESTH 20 SQ CM 1 ICD-10 Description Diagnosis S61.211A Laceration without foreign body of left index finger without damage to nail, initial encounter T81.31XA Disruption of  external operation (surgical) wound, not elsewhere classified, initial encounter Electronic Signature(s) Signed: 04/11/2015 10:33:31 AM By: Evlyn Kanner MD, FACS Entered By: Evlyn Kanner on 04/11/2015 10:33:31
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
## 2015-04-18 ENCOUNTER — Encounter: Payer: BLUE CROSS/BLUE SHIELD | Admitting: Surgery

## 2015-04-18 DIAGNOSIS — S61211A Laceration without foreign body of left index finger without damage to nail, initial encounter: Secondary | ICD-10-CM | POA: Diagnosis not present
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
## 2015-04-19 NOTE — Progress Notes (Signed)
Dwayne Moreno ([id number]) Visit Report for 04/18/2015 Chief Complaint Document Details Patient Name: Dwayne Moreno, Dwayne Moreno Date of Service: 04/18/2015 10:15 AM Medical Record Number: [id number] Patient Account Number: [id number] Date of Birth/Sex: 02-Dec-1950 (64 y.o. Male) Treating RN: Huel Coventry Primary Care Physician: Jessee Avers Other Clinician: Referring Physician: Jessee Avers Treating Physician/Extender: Rudene Re in Treatment: 2 Information Obtained from: Patient Chief Complaint Patient presents to the wound care center for a consult due non healing wound. 64 year old gentleman who had an injury to his left index finger with a table saw on 03/07/2015. Electronic Signature(s) Signed: 04/18/2015 10:46:24 AM By: Evlyn Kanner MD, FACS Entered By: Evlyn Kanner on 04/18/2015 10:46:24 Dwayne Moreno ([id number]) -------------------------------------------------------------------------------- HPI Details Patient Name: Dwayne Moreno Date of Service: 04/18/2015 10:15 AM Medical Record Number: [id number] Patient Account Number: [id number] Date of Birth/Sex: Jan 14, 1951 (64 y.o. Male) Treating RN: Huel Coventry Primary Care Physician: Jessee Avers Other Clinician: Referring Physician: Jessee Avers Treating Physician/Extender: Rudene Re in Treatment: 2 History of Present Illness Location: injury to the palmar aspect of the left index finger Quality: Patient reports experiencing a dull pain to affected area(s). Severity: Patient states wound are getting worse. Duration: Patient has had the wound for < 4 weeks prior to presenting for treatment Timing: Pain in wound is Intermittent (comes and goes Context: The wound occurred when the patient cut himself with a table saw while working Modifying Factors: Consults to this date include: urgent care and ER at [facility] Associated Signs and Symptoms: Patient reports having: numbness in the tip of the finger and difficulty in flexing  his finger at all joints. HPI Description: The patient had a injury with a table saw on 03/07/2015 and was seen in the ER. He was sent to [facility] for a orthopedic opinion and seek further care there. The wound was loosely sutured in the ER here and was sent for further care to [facility]. He was seen at [facility] and after discussing with the ER physicians who spoke to the plastic surgeons they recommended no further intervention at that stage. He was given an IV antibiotic and some tetanus toxoid. The sutures were removed subsequently in 2 weeks at the Urgent care and the patient was previously on Septra and was recently changed over to Keflex. He was referred to [place] for poor wound healing and open fracture of the distal phalanx of second finger of the left hand. X-ray of the left hand done on 03/07/2015 -- IMPRESSION:Open fracture involving the distal phalanx of the index finger as described. Small associated foreign bodies difficult to exclude. Repeat x-ray done on 03/28/2015 - IMPRESSION:Cortical defect of the volar aspect of the left second finger distal phalanx once again noted. Adjacent radiopaque material which may represent displaced ossific fragments. Adjacent soft tissue defect. The patient reports to have less sensation in the tip of his finger and is unable to bend the finger as before. He has minimal pain in this area. He has not seen a hand surgeon, plastic surgeon or a orthopedic surgeon since the injury. 04/11/2015 -- he has completed his course of antibiotics and has an appointment to see a plastic and reconstructive surgeon at [facility] this week. 04/18/2015 -- he went and saw the plastic surgeon at [facility] and the basic opinion was to continue with physiotherapy but no surgical intervention was planned. Electronic Signature(s) Signed: 04/18/2015 10:46:47 AM By: Evlyn Kanner MD, FACS Entered By: Evlyn Kanner on 04/18/2015 10:46:47 Dwayne Moreno, Dwayne Moreno  ([id number]) -------------------------------------------------------------------------------- Physical Exam Details  Patient Name: Dwayne Moreno Date of Service: 04/18/2015 10:15 AM Medical Record Number: [id number] Patient Account Number: [id number] Date of Birth/Sex: [birth year] (64 y.o. Male) Treating RN: Huel Coventry Primary Care Physician: Jessee Avers Other Clinician: Referring Physician: Jessee Avers Treating Physician/Extender: Rudene Re in Treatment: 2 Constitutional . Pulse regular. Respirations normal and unlabored. Afebrile. . Eyes Nonicteric. Reactive to light. Ears, Nose, Mouth, and Throat Lips, teeth, and gums WNL.Marland Kitchen Moist mucosa without lesions . Neck supple and nontender. No palpable supraclavicular or cervical adenopathy. Normal sized without goiter. Respiratory WNL. No retractions.. Cardiovascular Pedal Pulses WNL. No clubbing, cyanosis or edema. Chest Breasts symmetical and no nipple discharge.. Breast tissue WNL, no masses, lumps, or tenderness.. Lymphatic No adneopathy. No adenopathy. No adenopathy. Musculoskeletal Adexa without tenderness or enlargement.. Digits and nails w/o clubbing, cyanosis, infection, petechiae, ischemia, or inflammatory conditions.. Integumentary (Hair, Skin) No suspicious lesions. No crepitus or fluctuance. No peri-wound warmth or erythema. No masses.Marland Kitchen Psychiatric Judgement and insight Intact.. No evidence of depression, anxiety, or agitation.. Notes The left index finger looks very good and the area under the scar is healed. Electronic Signature(s) Signed: 04/18/2015 10:47:17 AM By: Evlyn Kanner MD, FACS Entered By: Evlyn Kanner on 04/18/2015 10:47:16 Dwayne Moreno ([id number]) -------------------------------------------------------------------------------- Physician Orders Details Patient Name: Dwayne Moreno Date of Service: 04/18/2015 10:15 AM Medical Record Number: [id number] Patient Account Number: [id number] Date of  Birth/Sex: 17-Nov-1950 (64 y.o. Male) Treating RN: Huel Coventry Primary Care Physician: Jessee Avers Other Clinician: Referring Physician: Jessee Avers Treating Physician/Extender: Rudene Re in Treatment: 2 Verbal / Phone Orders: Yes Clinician: Huel Coventry Read Back and Verified: Yes Diagnosis Coding Wound Cleansing Wound #1 Left Hand - 2nd Digit o Clean wound with Normal Saline. Anesthetic Wound #1 Left Hand - 2nd Digit o Topical Lidocaine 4% cream applied to wound bed prior to debridement Primary Wound Dressing Wound #1 Left Hand - 2nd Digit o Mepitel One Dressing Change Frequency Wound #1 Left Hand - 2nd Digit o Change dressing every other day. - keep dressing dry Follow-up Appointments Wound #1 Left Hand - 2nd Digit o Return Appointment in 1 week. Electronic Signature(s) Signed: 04/18/2015 4:35:29 PM By: Evlyn Kanner MD, FACS Signed: 04/18/2015 5:52:12 PM By: Elliot Gurney RN, BSN, Kim RN, BSN Entered By: Elliot Gurney, RN, BSN, Kim on 04/18/2015 10:35:33 Dwayne Moreno, Dwayne Moreno ([id number]) -------------------------------------------------------------------------------- Problem List Details Patient Name: Dwayne Moreno Date of Service: 04/18/2015 10:15 AM Medical Record Number: [id number] Patient Account Number: [id number] Date of Birth/Sex: [birth year] (64 y.o. Male) Treating RN: Huel Coventry Primary Care Physician: Jessee Avers Other Clinician: Referring Physician: Jessee Avers Treating Physician/Extender: Rudene Re in Treatment: 2 Active Problems ICD-10 Encounter Code Description Active Date Diagnosis S61.211A Laceration without foreign body of left index finger without 04/04/2015 Yes damage to nail, initial encounter T81.31XA Disruption of external operation (surgical) wound, not 04/04/2015 Yes elsewhere classified, initial encounter Inactive Problems Resolved Problems Electronic Signature(s) Signed: 04/18/2015 10:46:17 AM By: Evlyn Kanner MD, FACS Entered  By: Evlyn Kanner on 04/18/2015 10:46:17 Dwayne Moreno ([id number]) -------------------------------------------------------------------------------- Progress Note Details Patient Name: Dwayne Moreno Date of Service: 04/18/2015 10:15 AM Medical Record Number: [id number] Patient Account Number: [id number] Date of Birth/Sex: 1950-12-26 (64 y.o. Male) Treating RN: Huel Coventry Primary Care Physician: Jessee Avers Other Clinician: Referring Physician: Jessee Avers Treating Physician/Extender: Rudene Re in Treatment: 2 Subjective Chief Complaint Information obtained from Patient Patient presents to the wound care center for a consult due non healing wound. 64 year old gentleman who had an injury to his left index finger with a  table saw on 03/07/2015. History of Present Illness (HPI) The following HPI elements were documented for the patient's wound: Location: injury to the palmar aspect of the left index finger Quality: Patient reports experiencing a dull pain to affected area(s). Severity: Patient states wound are getting worse. Duration: Patient has had the wound for < 4 weeks prior to presenting for treatment Timing: Pain in wound is Intermittent (comes and goes Context: The wound occurred when the patient cut himself with a table saw while working Modifying Factors: Consults to this date include: urgent care and ER at [facility] Associated Signs and Symptoms: Patient reports having: numbness in the tip of the finger and difficulty in flexing his finger at all joints. The patient had a injury with a table saw on 03/07/2015 and was seen in the ER. He was sent to [facility] for a orthopedic opinion and seek further care there. The wound was loosely sutured in the ER here and was sent for further care to [facility]. He was seen at [facility] and after discussing with the ER physicians who spoke to the plastic surgeons they recommended no further intervention at that stage.  He was given an IV antibiotic and some tetanus toxoid. The sutures were removed subsequently in 2 weeks at the Urgent care and the patient was previously on Septra and was recently changed over to Keflex. He was referred to [place] for poor wound healing and open fracture of the distal phalanx of second finger of the left hand. X-ray of the left hand done on 03/07/2015 -- IMPRESSION:Open fracture involving the distal phalanx of the index finger as described. Small associated foreign bodies difficult to exclude. Repeat x-ray done on 03/28/2015 - IMPRESSION:Cortical defect of the volar aspect of the left second finger distal phalanx once again noted. Adjacent radiopaque material which may represent displaced ossific fragments. Adjacent soft tissue defect. The patient reports to have less sensation in the tip of his finger and is unable to bend the finger as before. He has minimal pain in this area. He has not seen a hand surgeon, plastic surgeon or a orthopedic surgeon since the injury. 04/11/2015 -- he has completed his course of antibiotics and has an appointment to see a plastic and reconstructive surgeon at [facility] this week. 04/18/2015 -- he went and saw the plastic surgeon at [facility] and the basic opinion was to continue [facility], Dwayne Moreno ([id number]) with physiotherapy but no surgical intervention was planned. Objective Constitutional Pulse regular. Respirations normal and unlabored. Afebrile. Vitals Time Taken: 10:23 AM, Height: 69 in, Weight: 139 lbs, BMI: 20.5, Temperature: 98.4 F, Pulse: 61 bpm, Respiratory Rate: 18 breaths/min, Blood Pressure: 134/69 mmHg. Eyes Nonicteric. Reactive to light. Ears, Nose, Mouth, and Throat Lips, teeth, and gums WNL.Marland Kitchen Moist mucosa without lesions . Neck supple and nontender. No palpable supraclavicular or cervical adenopathy. Normal sized without goiter. Respiratory WNL. No retractions.. Cardiovascular Pedal Pulses WNL. No clubbing, cyanosis or  edema. Chest Breasts symmetical and no nipple discharge.. Breast tissue WNL, no masses, lumps, or tenderness.. Lymphatic No adneopathy. No adenopathy. No adenopathy. Musculoskeletal Adexa without tenderness or enlargement.. Digits and nails w/o clubbing, cyanosis, infection, petechiae, ischemia, or inflammatory conditions.Marland Kitchen Psychiatric Judgement and insight Intact.. No evidence of depression, anxiety, or agitation.. General Notes: The left index finger looks very good and the area under the scar is healed. Integumentary (Hair, Skin) No suspicious lesions. No crepitus or fluctuance. No peri-wound warmth or erythema. No masses.Marland Kitchen Dwayne Moreno, Dwayne Moreno ([id number]) Wound #1 status is Open.  Original cause of wound was Trauma. The wound is located on the Left Hand - 2nd Digit. The wound measures 1.4cm length x 0.1cm width x 0.1cm depth; 0.11cm^2 area and 0.011cm^3 volume. The wound is limited to skin breakdown. There is a small amount of serous drainage noted. The wound margin is indistinct and nonvisible. There is large (67-100%) red granulation within the wound bed. There is a small (1-33%) amount of necrotic tissue within the wound bed including Eschar. The periwound skin appearance exhibited: Scarring, Moist. The periwound skin appearance did not exhibit: Callus, Crepitus, Excoriation, Fluctuance, Friable, Induration, Localized Edema, Rash, Dry/Scaly, Maceration, Atrophie Blanche, Cyanosis, Ecchymosis, Hemosiderin Staining, Mottled, Pallor, Rubor, Erythema. Assessment Active Problems ICD-10 J47.829F - Laceration without foreign body of left index finger without damage to nail, initial encounter T81.31XA - Disruption of external operation (surgical) wound, not elsewhere classified, initial encounter I have recommended Mepitel to be applied and left with a dry bandage and a fingerstall so as to keep it protected. He can change is about a couple of times in a week and see me back next week. I  anticipated to be completely healed and he can continue with his physiotherapy. Plan Wound Cleansing: Wound #1 Left Hand - 2nd Digit: Clean wound with Normal Saline. Anesthetic: Wound #1 Left Hand - 2nd Digit: Topical Lidocaine 4% cream applied to wound bed prior to debridement Primary Wound Dressing: Wound #1 Left Hand - 2nd Digit: Mepitel One Dressing Change Frequency: Wound #1 Left Hand - 2nd Digit: Change dressing every other day. - keep dressing dry Follow-up Appointments: Wound #1 Left Hand - 2nd Digit: Return Appointment in 1 week. Dwayne Moreno, Dwayne Moreno ([id number]) I have recommended Mepitel to be applied and left with a dry bandage and a fingerstall so as to keep it protected. He can change is about a couple of times in a week and see me back next week. I anticipated to be completely healed and he can continue with his physiotherapy. Electronic Signature(s) Signed: 04/18/2015 10:48:28 AM By: Evlyn Kanner MD, FACS Entered By: Evlyn Kanner on 04/18/2015 10:48:27 Dwayne Moreno ([id number]) -------------------------------------------------------------------------------- SuperBill Details Patient Name: Dwayne Moreno Date of Service: 04/18/2015 Medical Record Number: [id number] Patient Account Number: [id number] Date of Birth/Sex: [birth year] (64 y.o. Male) Treating RN: Huel Coventry Primary Care Physician: Jessee Avers Other Clinician: Referring Physician: Jessee Avers Treating Physician/Extender: Rudene Re in Treatment: 2 Diagnosis Coding ICD-10 Codes Code Description Laceration without foreign body of left index finger without damage to nail, initial S61.211A encounter Disruption of external operation (surgical) wound, not elsewhere classified, initial T81.31XA encounter Facility Procedures CPT4 Code: [id number] Description: [phone] - WOUND CARE VISIT-LEV 2 EST PT Modifier: Quantity: 1 Physician Procedures CPT4: Description Modifier Quantity Code [id number] - WC  PHYS LEVEL 3 - EST PT 1 ICD-10 Description Diagnosis S61.211A Laceration without foreign body of left index finger without damage to nail, initial encounter T81.31XA Disruption of external  operation (surgical) wound, not elsewhere classified, initial encounter Electronic Signature(s) Signed: 04/18/2015 10:49:11 AM By: Evlyn Kanner MD, FACS Previous Signature: 04/18/2015 10:48:40 AM Version By: Evlyn Kanner MD, FACS Entered By: Evlyn Kanner on 04/18/2015 10:49:11

## 2015-04-25 ENCOUNTER — Ambulatory Visit: Payer: Self-pay | Admitting: General Surgery

## 2015-04-28 ENCOUNTER — Encounter: Payer: BLUE CROSS/BLUE SHIELD | Attending: Surgery | Admitting: Surgery

## 2015-04-28 DIAGNOSIS — W312XXA Contact with powered woodworking and forming machines, initial encounter: Secondary | ICD-10-CM | POA: Insufficient documentation

## 2015-04-28 DIAGNOSIS — S61211A Laceration without foreign body of left index finger without damage to nail, initial encounter: Secondary | ICD-10-CM | POA: Diagnosis present

## 2015-04-28 DIAGNOSIS — T8131XA Disruption of external operation (surgical) wound, not elsewhere classified, initial encounter: Secondary | ICD-10-CM | POA: Insufficient documentation

## 2015-04-29 NOTE — Progress Notes (Signed)
JADA, KUHNERT ([id number]) Visit Report for 04/28/2015 Chief Complaint Document Details Patient Name: Dwayne Moreno, Dwayne Moreno Date of Service: 04/28/2015 8:00 AM Medical Record Number: [id number] Patient Account Number: [id number] Date of Birth/Sex: [birth year] (64 y.o. Male) Treating RN: Curtis Sites Primary Care Physician: Jessee Avers Other Clinician: Referring Physician: Jessee Avers Treating Physician/Extender: Rudene Re in Treatment: 3 Information Obtained from: Patient Chief Complaint Patient presents to the wound care center for a consult due non healing wound. 65 year old gentleman who had an injury to his left index finger with a table saw on 03/07/2015. Electronic Signature(s) Signed: 04/28/2015 8:40:50 AM By: Evlyn Kanner MD, FACS Entered By: Evlyn Kanner on 04/28/2015 08:40:50 Dwayne Moreno ([id number]) -------------------------------------------------------------------------------- HPI Details Patient Name: Dwayne Moreno Date of Service: 04/28/2015 8:00 AM Medical Record Number: [id number] Patient Account Number: [id number] Date of Birth/Sex: [birth year] (64 y.o. Male) Treating RN: Curtis Sites Primary Care Physician: Jessee Avers Other Clinician: Referring Physician: Jessee Avers Treating Physician/Extender: Rudene Re in Treatment: 3 History of Present Illness Location: injury to the palmar aspect of the left index finger Quality: Patient reports experiencing a dull pain to affected area(s). Severity: Patient states wound are getting worse. Duration: Patient has had the wound for < 4 weeks prior to presenting for treatment Timing: Pain in wound is Intermittent (comes and goes Context: The wound occurred when the patient cut himself with a table saw while working Modifying Factors: Consults to this date include: urgent care and ER at [facility] Associated Signs and Symptoms: Patient reports having: numbness in the tip of the finger and difficulty in  flexing his finger at all joints. HPI Description: The patient had a injury with a table saw on 03/07/2015 and was seen in the ER. He was sent to [facility] for a orthopedic opinion and seek further care there. The wound was loosely sutured in the ER here and was sent for further care to [facility]. He was seen at [facility] and after discussing with the ER physicians who spoke to the plastic surgeons they recommended no further intervention at that stage. He was given an IV antibiotic and some tetanus toxoid. The sutures were removed subsequently in 2 weeks at the Urgent care and the patient was previously on Septra and was recently changed over to Keflex. He was referred to [place] for poor wound healing and open fracture of the distal phalanx of second finger of the left hand. X-ray of the left hand done on 03/07/2015 -- IMPRESSION:Open fracture involving the distal phalanx of the index finger as described. Small associated foreign bodies difficult to exclude. Repeat x-ray done on 03/28/2015 - IMPRESSION:Cortical defect of the volar aspect of the left second finger distal phalanx once again noted. Adjacent radiopaque material which may represent displaced ossific fragments. Adjacent soft tissue defect. The patient reports to have less sensation in the tip of his finger and is unable to bend the finger as before. He has minimal pain in this area. He has not seen a hand surgeon, plastic surgeon or a orthopedic surgeon since the injury. 04/11/2015 -- he has completed his course of antibiotics and has an appointment to see a plastic and reconstructive surgeon at [facility] this week. 04/18/2015 -- he went and saw the plastic surgeon at [facility] and the basic opinion was to continue with physiotherapy but no surgical intervention was planned. Electronic Signature(s) Signed: 04/28/2015 8:40:54 AM By: Evlyn Kanner MD, FACS Entered By: Evlyn Kanner on 04/28/2015 08:40:54 Dwayne Moreno, Dwayne Moreno  ([id number]) -------------------------------------------------------------------------------- Physical Exam Details  Patient Name: Dwayne Moreno, Dwayne Moreno Date of Service: 04/28/2015 8:00 AM Medical Record Number: [id number] Patient Account Number: [id number] Date of Birth/Sex: 1951-04-19 (64 y.o. Male) Treating RN: Curtis Sites Primary Care Physician: Jessee Avers Other Clinician: Referring Physician: Jessee Avers Treating Physician/Extender: Rudene Re in Treatment: 3 Constitutional . Pulse regular. Respirations normal and unlabored. Afebrile. . Eyes Nonicteric. Reactive to light. Ears, Nose, Mouth, and Throat Lips, teeth, and gums WNL.Marland Kitchen Moist mucosa without lesions . Neck supple and nontender. No palpable supraclavicular or cervical adenopathy. Normal sized without goiter. Respiratory WNL. No retractions.. Cardiovascular Pedal Pulses WNL. No clubbing, cyanosis or edema. Lymphatic No adneopathy. No adenopathy. No adenopathy. Musculoskeletal Adexa without tenderness or enlargement.. Digits and nails w/o clubbing, cyanosis, infection, petechiae, ischemia, or inflammatory conditions.. Integumentary (Hair, Skin) No suspicious lesions. No crepitus or fluctuance. No peri-wound warmth or erythema. No masses.Marland Kitchen Psychiatric Judgement and insight Intact.. No evidence of depression, anxiety, or agitation.. Notes the wound on the left finger is completely healed and there is no evidence of any surrounding cellulitis. Electronic Signature(s) Signed: 04/28/2015 8:41:25 AM By: Evlyn Kanner MD, FACS Entered By: Evlyn Kanner on 04/28/2015 08:41:25 Dwayne Moreno ([id number]) -------------------------------------------------------------------------------- Physician Orders Details Patient Name: Dwayne Moreno Date of Service: 04/28/2015 8:00 AM Medical Record Number: [id number] Patient Account Number: [id number] Date of Birth/Sex: 03-14-1951 (64 y.o. Male) Treating RN: Curtis Sites Primary Care  Physician: Jessee Avers Other Clinician: Referring Physician: Jessee Avers Treating Physician/Extender: Rudene Re in Treatment: 3 Verbal / Phone Orders: Yes Clinician: Curtis Sites Read Back and Verified: Yes Diagnosis Coding Discharge From [facility] Services o Discharge from Wound Care Center Electronic Signature(s) Signed: 04/28/2015 4:57:18 PM By: Evlyn Kanner MD, FACS Signed: 04/28/2015 5:38:26 PM By: Curtis Sites Entered By: Curtis Sites on 04/28/2015 08:15:08 Dwayne Moreno ([id number]) -------------------------------------------------------------------------------- Problem List Details Patient Name: Dwayne Moreno Date of Service: 04/28/2015 8:00 AM Medical Record Number: [id number] Patient Account Number: [id number] Date of Birth/Sex: 31-Oct-1950 (64 y.o. Male) Treating RN: Curtis Sites Primary Care Physician: Jessee Avers Other Clinician: Referring Physician: Jessee Avers Treating Physician/Extender: Rudene Re in Treatment: 3 Active Problems ICD-10 Encounter Code Description Active Date Diagnosis S61.211A Laceration without foreign body of left index finger without 04/04/2015 Yes damage to nail, initial encounter T81.31XA Disruption of external operation (surgical) wound, not 04/04/2015 Yes elsewhere classified, initial encounter Inactive Problems Resolved Problems Electronic Signature(s) Signed: 04/28/2015 8:40:44 AM By: Evlyn Kanner MD, FACS Entered By: Evlyn Kanner on 04/28/2015 08:40:44 Dwayne Moreno ([id number]) -------------------------------------------------------------------------------- Progress Note Details Patient Name: Dwayne Moreno Date of Service: 04/28/2015 8:00 AM Medical Record Number: [id number] Patient Account Number: [id number] Date of Birth/Sex: 1951/05/27 (64 y.o. Male) Treating RN: Curtis Sites Primary Care Physician: Jessee Avers Other Clinician: Referring Physician: Jessee Avers Treating Physician/Extender: Rudene Re in Treatment: 3 Subjective Chief Complaint Information obtained from Patient Patient presents to the wound care center for a consult due non healing wound. 64 year old gentleman who had an injury to his left index finger with a table saw on 03/07/2015. History of Present Illness (HPI) The following HPI elements were documented for the patient's wound: Location: injury to the palmar aspect of the left index finger Quality: Patient reports experiencing a dull pain to affected area(s). Severity: Patient states wound are getting worse. Duration: Patient has had the wound for < 4 weeks prior to presenting for treatment Timing: Pain in wound is Intermittent (comes and goes Context: The wound occurred when the patient cut himself with a table saw while working Modifying Factors: Consults to this  date include: urgent care and ER at [facility] Associated Signs and Symptoms: Patient reports having: numbness in the tip of the finger and difficulty in flexing his finger at all joints. The patient had a injury with a table saw on 03/07/2015 and was seen in the ER. He was sent to [facility] for a orthopedic opinion and seek further care there. The wound was loosely sutured in the ER here and was sent for further care to [facility]. He was seen at [facility] and after discussing with the ER physicians who spoke to the plastic surgeons they recommended no further intervention at that stage. He was given an IV antibiotic and some tetanus toxoid. The sutures were removed subsequently in 2 weeks at the Urgent care and the patient was previously on Septra and was recently changed over to Keflex. He was referred to [place] for poor wound healing and open fracture of the distal phalanx of second finger of the left hand. X-ray of the left hand done on 03/07/2015 -- IMPRESSION:Open fracture involving the distal phalanx of the index finger as described. Small associated foreign bodies difficult to  exclude. Repeat x-ray done on 03/28/2015 - IMPRESSION:Cortical defect of the volar aspect of the left second finger distal phalanx once again noted. Adjacent radiopaque material which may represent displaced ossific fragments. Adjacent soft tissue defect. The patient reports to have less sensation in the tip of his finger and is unable to bend the finger as before. He has minimal pain in this area. He has not seen a hand surgeon, plastic surgeon or a orthopedic surgeon since the injury. 04/11/2015 -- he has completed his course of antibiotics and has an appointment to see a plastic and reconstructive surgeon at [facility] this week. 04/18/2015 -- he went and saw the plastic surgeon at [facility] and the basic opinion was to continue [facility], Dwayne Moreno ([id number]) with physiotherapy but no surgical intervention was planned. Objective Constitutional Pulse regular. Respirations normal and unlabored. Afebrile. Vitals Time Taken: 8:05 AM, Height: 69 in, Weight: 139 lbs, BMI: 20.5, Temperature: 98.6 F, Pulse: 65 bpm, Respiratory Rate: 18 breaths/min, Blood Pressure: 126/89 mmHg. Eyes Nonicteric. Reactive to light. Ears, Nose, Mouth, and Throat Lips, teeth, and gums WNL.Marland Kitchen Moist mucosa without lesions . Neck supple and nontender. No palpable supraclavicular or cervical adenopathy. Normal sized without goiter. Respiratory WNL. No retractions.. Cardiovascular Pedal Pulses WNL. No clubbing, cyanosis or edema. Lymphatic No adneopathy. No adenopathy. No adenopathy. Musculoskeletal Adexa without tenderness or enlargement.. Digits and nails w/o clubbing, cyanosis, infection, petechiae, ischemia, or inflammatory conditions.Marland Kitchen Psychiatric Judgement and insight Intact.. No evidence of depression, anxiety, or agitation.. General Notes: the wound on the left finger is completely healed and there is no evidence of any surrounding cellulitis. Integumentary (Hair, Skin) No suspicious lesions. No crepitus  or fluctuance. No peri-wound warmth or erythema. No masses.. Wound #1 status is Open. Original cause of wound was Trauma. The wound is located on the Left Tyreke Kaeser, Dwayne Moreno ([id number]) 2nd Digit. The wound measures 0cm length x 0cm width x 0cm depth; 0cm^2 area and 0cm^3 volume. Assessment Active Problems ICD-10 N82.956O - Laceration without foreign body of left index finger without damage to nail, initial encounter T81.31XA - Disruption of external operation (surgical) wound, not elsewhere classified, initial encounter The wound having completely healed I have had a discussion with him regarding the nature of the scar tissue and the fact that he should protect this finger tip as much as possible. I was asked him  to continue with physiotherapy and all other supportive care. He is discharged from the wound care services and will be seen back as needed. Plan Discharge From [facility] Services: Discharge from Wound Care Center The wound having completely healed I have had a discussion with him regarding the nature of the scar tissue and the fact that he should protect this finger tip as much as possible. I was asked him to continue with physiotherapy and all other supportive care. He is discharged from the wound care services and will be seen back as needed. Electronic Signature(s) Signed: 04/28/2015 8:43:08 AM By: Evlyn Kanner MD, FACS Entered By: Evlyn Kanner on 04/28/2015 08:43:08 Dwayne Moreno ([id number]) -------------------------------------------------------------------------------- SuperBill Details Patient Name: Dwayne Moreno Date of Service: 04/28/2015 Medical Record Number: [id number] Patient Account Number: [id number] Date of Birth/Sex: 1951/04/20 (64 y.o. Male) Treating RN: Curtis Sites Primary Care Physician: Jessee Avers Other Clinician: Referring Physician: Jessee Avers Treating Physician/Extender: Rudene Re in Treatment: 3 Diagnosis Coding ICD-10 Codes Code  Description Laceration without foreign body of left index finger without damage to nail, initial S61.211A encounter Disruption of external operation (surgical) wound, not elsewhere classified, initial T81.31XA encounter Facility Procedures CPT4 Code: [id number] Description: 24401 - WOUND CARE VISIT-LEV 2 EST PT Modifier: Quantity: 1 Physician Procedures CPT4: Description Modifier Quantity Code [id number] 99213 - WC PHYS LEVEL 3 - EST PT 1 ICD-10 Description Diagnosis S61.211A Laceration without foreign body of left index finger without damage to nail, initial encounter T81.31XA Disruption of external  operation (surgical) wound, not elsewhere classified, initial encounter Electronic Signature(s) Signed: 04/28/2015 8:43:20 AM By: Evlyn Kanner MD, FACS Entered By: Evlyn Kanner on 04/28/2015 08:43:20

## 2015-04-29 NOTE — Progress Notes (Signed)
Dwayne Moreno ([id number]) Visit Report for 04/28/2015 Arrival Information Details Patient Name: Dwayne Moreno, Dwayne Moreno Date of Service: 04/28/2015 8:00 AM Medical Record Number: [id number] Patient Account Number: [id number] Date of Birth/Sex: 05/14/1951 (64 y.o. Male) Treating RN: Dwayne Moreno Primary Care Physician: Dwayne Moreno Other Clinician: Referring Physician: Jessee Moreno Treating Physician/Extender: Dwayne Moreno in Treatment: 3 Visit Information History Since Last Visit Added or deleted any medications: No Patient Arrived: Ambulatory Any new allergies or adverse reactions: No Arrival Time: 08:04 Had a fall or experienced change in No Accompanied By: self activities of daily living that may affect Transfer Assistance: None risk of falls: Patient Identification Verified: Yes Signs or symptoms of abuse/neglect since last No Secondary Verification Process Yes visito Completed: Hospitalized since last visit: No Patient Has Alerts: Yes Pain Present Now: No Patient Alerts: Aspirin Electronic Signature(s) Signed: 04/28/2015 5:38:26 PM By: Dwayne Moreno Entered By: Dwayne Moreno on 04/28/2015 08:04:51 Dwayne Moreno ([id number]) -------------------------------------------------------------------------------- Clinic Level of Care Assessment Details Patient Name: Dwayne Moreno Date of Service: 04/28/2015 8:00 AM Medical Record Number: [id number] Patient Account Number: [id number] Date of Birth/Sex: May 10, 1951 (64 y.o. Male) Treating RN: Dwayne Moreno Primary Care Physician: Dwayne Moreno Other Clinician: Referring Physician: Jessee Moreno Treating Physician/Extender: Dwayne Moreno in Treatment: 3 Clinic Level of Care Assessment Items TOOL 4 Quantity Score  - Use when only an EandM is performed on FOLLOW-UP visit 0 ASSESSMENTS - Nursing Assessment / Reassessment X - Reassessment of Co-morbidities (includes updates in patient status) 1 10 X - Reassessment of Adherence  to Treatment Plan 1 5 ASSESSMENTS - Wound and Skin Assessment / Reassessment  - Simple Wound Assessment / Reassessment - one wound 0  - Complex Wound Assessment / Reassessment - multiple wounds 0  - Dermatologic / Skin Assessment (not related to wound area) 0 ASSESSMENTS - Focused Assessment  - Circumferential Edema Measurements - multi extremities 0  - Nutritional Assessment / Counseling / Intervention 0  - Lower Extremity Assessment (monofilament, tuning fork, pulses) 0  - Peripheral Arterial Disease Assessment (using hand held doppler) 0 ASSESSMENTS - Ostomy and/or Continence Assessment and Care  - Incontinence Assessment and Management 0  - Ostomy Care Assessment and Management (repouching, etc.) 0 PROCESS - Coordination of Care X - Simple Patient / Family Education for ongoing care 1 15  - Complex (extensive) Patient / Family Education for ongoing care 0  - Staff obtains Chiropractor, Records, Test Results / Process Orders 0  - Staff telephones HHA, Nursing Homes / Clarify orders / etc 0  - Routine Transfer to another Facility (non-emergent condition) 0 Dwayne Moreno ([id number])  - Routine Hospital Admission (non-emergent condition) 0  - New Admissions / Manufacturing engineer / Ordering NPWT, Apligraf, etc. 0  - Emergency Hospital Admission (emergent condition) 0 X - Simple Discharge Coordination 1 10  - Complex (extensive) Discharge Coordination 0 PROCESS - Special Needs  - Pediatric / Minor Patient Management 0  - Isolation Patient Management 0  - Hearing / Language / Visual special needs 0  - Assessment of Community assistance (transportation, D/C planning, etc.) 0  - Additional assistance / Altered mentation 0  - Support Surface(s) Assessment (bed, cushion, seat, etc.) 0 INTERVENTIONS - Wound Cleansing / Measurement  - Simple Wound Cleansing - one wound 0  - Complex Wound Cleansing - multiple wounds 0  - Wound Imaging  (photographs - any number of wounds) 0  - Wound Tracing (instead of photographs) 0  - Simple Wound Measurement - one wound 0  - Complex  Wound Measurement - multiple wounds 0 INTERVENTIONS - Wound Dressings []  - Small Wound Dressing one or multiple wounds 0 []  - Medium Wound Dressing one or multiple wounds 0 []  - Large Wound Dressing one or multiple wounds 0 []  - Application of Medications - topical 0 []  - Application of Medications - injection 0 INTERVENTIONS - Miscellaneous []  - External ear exam 0 Dwayne Moreno ([id number]) []  - Specimen Collection (cultures, biopsies, blood, body fluids, etc.) 0 []  - Specimen(s) / Culture(s) sent or taken to Lab for analysis 0 []  - Patient Transfer (multiple staff / Michiel Moreno Lift / Similar devices) 0 []  - Simple Staple / Suture removal (25 or less) 0 []  - Complex Staple / Suture removal (26 or more) 0 []  - Hypo / Hyperglycemic Management (close monitor of Blood Glucose) 0 []  - Ankle / Brachial Index (ABI) - do not check if billed separately 0 X - Vital Signs 1 5 Has the patient been seen at the hospital within the last three years: Yes Total Score: 45 Level Of Care: New/Established - Level 2 Electronic Signature(s) Signed: 04/28/2015 5:38:26 PM By: Dwayne Moreno Entered By: Dwayne Moreno on 04/28/2015 08:15:46 Dwayne Moreno ([id number]) -------------------------------------------------------------------------------- Encounter Discharge Information Details Patient Name: Dwayne Moreno Date of Service: 04/28/2015 8:00 AM Medical Record Number: [id number] Patient Account Number: [id number] Date of Birth/Sex: 1951-07-21 (64 y.o. Male) Treating RN: Dwayne Moreno Primary Care Physician: Dwayne Moreno Other Clinician: Referring Physician: Jessee Moreno Treating Physician/Extender: Dwayne Moreno in Treatment: 3 Encounter Discharge Information Items Discharge Pain Level: 0 Discharge Condition: Stable Ambulatory Status: Ambulatory Discharge  Destination: Home Transportation: Private Auto Accompanied By: self Schedule Follow-up Appointment: Yes Medication Reconciliation completed and provided to Patient/Care No Leandro Berkowitz: Provided on Clinical Summary of Care: 04/28/2015 Form Type Recipient Paper Patient KM Electronic Signature(s) Signed: 04/28/2015 8:17:39 AM By: Gwenlyn Perking Entered By: Gwenlyn Perking on 04/28/2015 08:17:39 [place], Caryn Moreno ([id number]) -------------------------------------------------------------------------------- Multi-Disciplinary Care Plan Details Patient Name: Dwayne Moreno Date of Service: 04/28/2015 8:00 AM Medical Record Number: [id number] Patient Account Number: [id number] Date of Birth/Sex: Aug 12, 1951 (64 y.o. Male) Treating RN: Dwayne Moreno Primary Care Physician: Dwayne Moreno Other Clinician: Referring Physician: Jessee Moreno Treating Physician/Extender: Dwayne Moreno in Treatment: 3 Active Inactive Electronic Signature(s) Signed: 04/28/2015 5:38:26 PM By: Dwayne Moreno Entered By: Dwayne Moreno on 04/28/2015 08:14:50 Dwayne Moreno ([id number]) -------------------------------------------------------------------------------- Patient/Caregiver Education Details Patient Name: Dwayne Moreno Date of Service: 04/28/2015 8:00 AM Medical Record Number: [id number] Patient Account Number: [id number] Date of Birth/Gender: [birth year] (64 y.o. Male) Treating RN: Dwayne Moreno Primary Care Physician: Dwayne Moreno Other Clinician: Referring Physician: Jessee Moreno Treating Physician/Extender: Dwayne Moreno in Treatment: 3 Education Assessment Education Provided To: Patient Education Topics Provided Basic Hygiene: Handouts: Other: skin care of newly healed wound site Methods: Explain/Verbal Responses: State content correctly Electronic Signature(s) Signed: 04/28/2015 5:38:26 PM By: Dwayne Moreno Entered By: Dwayne Moreno on 04/28/2015 08:16:29 Dwayne Moreno  ([id number]) -------------------------------------------------------------------------------- Wound Assessment Details Patient Name: Dwayne Moreno Date of Service: 04/28/2015 8:00 AM Medical Record Number: [id number] Patient Account Number: [id number] Date of Birth/Sex: 11-27-1950 (64 y.o. Male) Treating RN: Dwayne Moreno Primary Care Physician: Dwayne Moreno Other Clinician: Referring Physician: Jessee Moreno Treating Physician/Extender: Dwayne Moreno in Treatment: 3 Wound Status Wound Number: 1 Primary Etiology: Trauma, Other Wound Location: Left Hand - 2nd Digit Wound Status: Open Wounding Event: Trauma Date Acquired: 03/07/2015 Weeks Of Treatment: 3 Clustered Wound: No Photos Photo Uploaded By: Dwayne Moreno on 04/28/2015 09:22:13 Wound Measurements Length: (cm) Width: (cm) Depth: (cm) Area: (cm)  Volume: (cm) 0 % Reduction in Area: 100% 0 % Reduction in Volume: 100% 0 0 0 Wound Description Full Thickness Without Exposed Classification: Support Structures Periwound Skin Texture Texture Color No Abnormalities Noted: No No Abnormalities Noted: No Moisture No Abnormalities Noted: No Electronic Signature(s) Signed: 04/28/2015 5:38:26 PM By: Janene Harvey, Caryn Moreno ([id number]) Entered By: Dwayne Moreno on 04/28/2015 08:09:56 Ju, Caryn Moreno ([id number]) -------------------------------------------------------------------------------- Vitals Details Patient Name: Dwayne Moreno Date of Service: 04/28/2015 8:00 AM Medical Record Number: [id number] Patient Account Number: [id number] Date of Birth/Sex: 1951-03-09 (64 y.o. Male) Treating RN: Dwayne Moreno Primary Care Physician: Dwayne Moreno Other Clinician: Referring Physician: Jessee Moreno Treating Physician/Extender: Dwayne Moreno in Treatment: 3 Vital Signs Time Taken: 08:05 Temperature (F): 98.6 Height (in): 69 Pulse (bpm): 65 Weight (lbs): 139 Respiratory Rate (breaths/min): 18 Body Mass  Index (BMI): 20.5 Blood Pressure (mmHg): 126/89 Reference Range: 80 - 120 mg / dl Electronic Signature(s) Signed: 04/28/2015 5:38:26 PM By: Dwayne Moreno Entered By: Dwayne Moreno on 04/28/2015 08:06:40

## 2016-12-01 IMAGING — CR DG FINGER INDEX 2+V*L*
3 series · 3 of 3 positions shown · non-contrast
Comparison: None.

CLINICAL DATA: Index finger laceration with saw today.

EXAM:
LEFT INDEX FINGER 2+V

[finger ap]
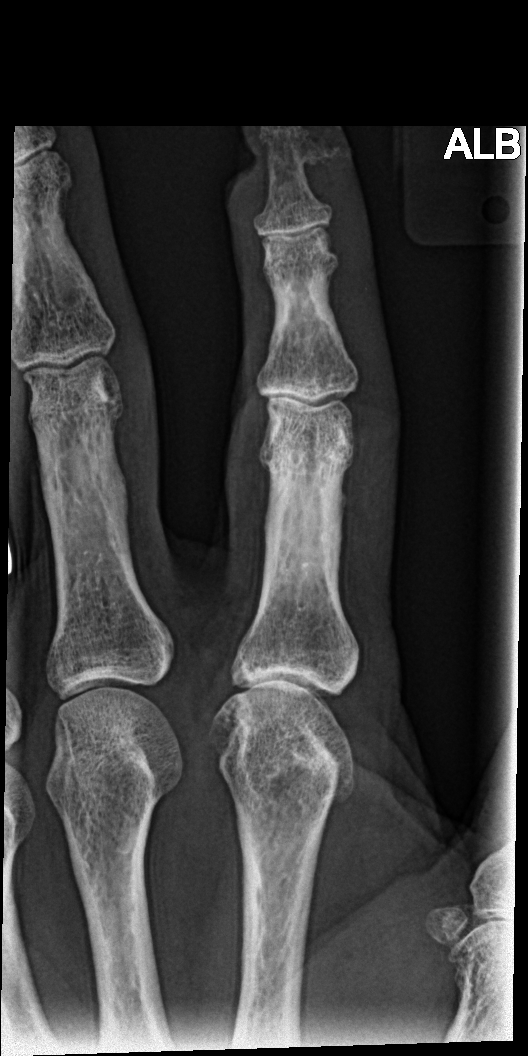

[finger obl]
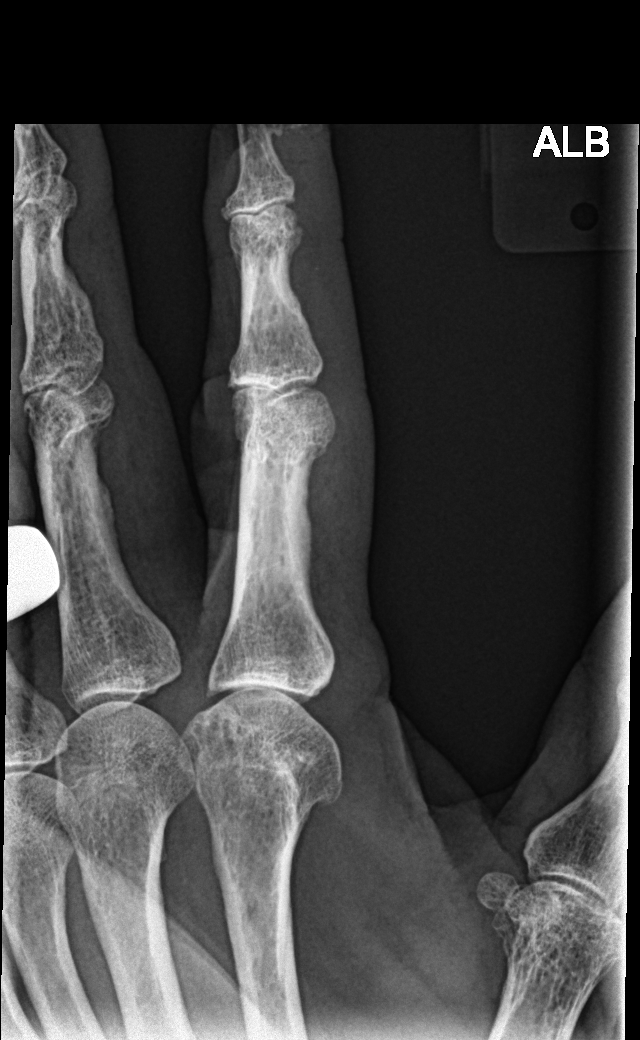

[finger lat]
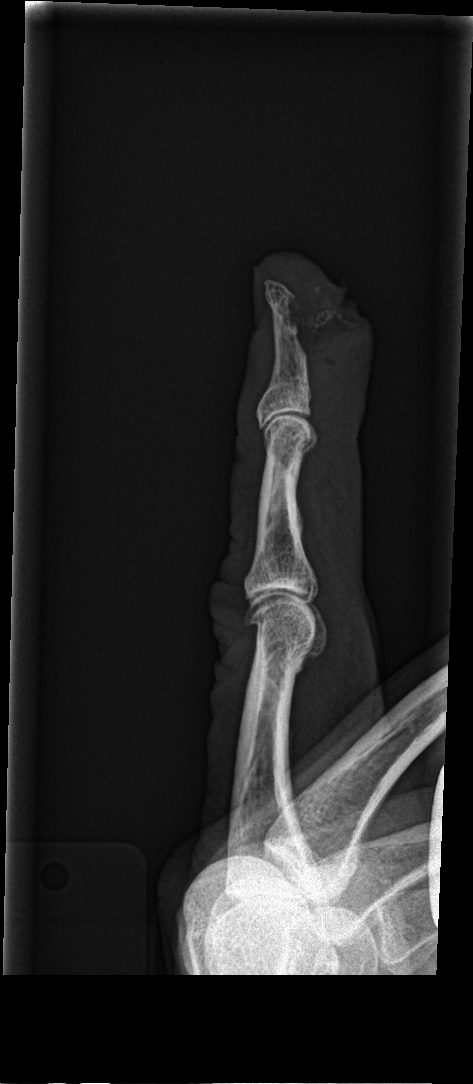

[3 of 3 positions shown; findings below may reference images not displayed]

FINDINGS: There is significant soft tissue injury involving the ulnar and
volar aspect of the distal index finger. There is a cortical defect
involving the volar aspect of the distal phalanx, best seen on the
lateral view. There are probable small fracture fragments within the
soft tissues. Some of these could reflect small foreign bodies.
There is soft tissue swelling throughout the index finger.
Degenerative changes are present at the metacarpal phalangeal joint.
IMPRESSION: Open fracture involving the distal phalanx of the index finger as
described. Small associated foreign bodies difficult to exclude.
